# Patient Record
Sex: Female | Born: 2000 | Race: Black or African American | Marital: Single | Smoking: Never smoker
Health system: Southern US, Community
[De-identification: ages and names within clinical notes are randomized; demographics above are authoritative.]

## PROBLEM LIST (undated history)

## (undated) DIAGNOSIS — Z789 Other specified health status: Secondary | ICD-10-CM

## (undated) DIAGNOSIS — O139 Gestational [pregnancy-induced] hypertension without significant proteinuria, unspecified trimester: Secondary | ICD-10-CM

## (undated) HISTORY — PX: LIPOMA EXCISION: SHX5283

## (undated) HISTORY — DX: Other specified health status: Z78.9

## (undated) HISTORY — DX: Gestational (pregnancy-induced) hypertension without significant proteinuria, unspecified trimester: O13.9

---

## 2018-07-26 DIAGNOSIS — R1030 Lower abdominal pain, unspecified: Secondary | ICD-10-CM | POA: Diagnosis not present

## 2018-07-26 DIAGNOSIS — N926 Irregular menstruation, unspecified: Secondary | ICD-10-CM | POA: Diagnosis not present

## 2018-07-26 DIAGNOSIS — N3000 Acute cystitis without hematuria: Secondary | ICD-10-CM | POA: Diagnosis not present

## 2018-07-31 DIAGNOSIS — M542 Cervicalgia: Secondary | ICD-10-CM | POA: Diagnosis not present

## 2018-07-31 DIAGNOSIS — R0600 Dyspnea, unspecified: Secondary | ICD-10-CM | POA: Diagnosis not present

## 2018-08-05 DIAGNOSIS — H1013 Acute atopic conjunctivitis, bilateral: Secondary | ICD-10-CM | POA: Diagnosis not present

## 2018-08-05 DIAGNOSIS — H40033 Anatomical narrow angle, bilateral: Secondary | ICD-10-CM | POA: Diagnosis not present

## 2018-08-14 DIAGNOSIS — D171 Benign lipomatous neoplasm of skin and subcutaneous tissue of trunk: Secondary | ICD-10-CM | POA: Diagnosis not present

## 2018-08-14 DIAGNOSIS — R109 Unspecified abdominal pain: Secondary | ICD-10-CM | POA: Diagnosis not present

## 2018-08-15 DIAGNOSIS — H1013 Acute atopic conjunctivitis, bilateral: Secondary | ICD-10-CM | POA: Diagnosis not present

## 2018-09-21 DIAGNOSIS — H5213 Myopia, bilateral: Secondary | ICD-10-CM | POA: Diagnosis not present

## 2018-12-26 DIAGNOSIS — Z3201 Encounter for pregnancy test, result positive: Secondary | ICD-10-CM | POA: Diagnosis not present

## 2018-12-26 DIAGNOSIS — N912 Amenorrhea, unspecified: Secondary | ICD-10-CM | POA: Diagnosis not present

## 2019-01-06 ENCOUNTER — Other Ambulatory Visit: Payer: Self-pay | Admitting: Obstetrics and Gynecology

## 2019-01-06 DIAGNOSIS — O3680X0 Pregnancy with inconclusive fetal viability, not applicable or unspecified: Secondary | ICD-10-CM

## 2019-01-07 ENCOUNTER — Other Ambulatory Visit: Payer: Self-pay

## 2019-01-07 ENCOUNTER — Ambulatory Visit (INDEPENDENT_AMBULATORY_CARE_PROVIDER_SITE_OTHER): Payer: Medicaid Other

## 2019-01-07 DIAGNOSIS — O3680X0 Pregnancy with inconclusive fetal viability, not applicable or unspecified: Secondary | ICD-10-CM

## 2019-01-07 DIAGNOSIS — Z3A11 11 weeks gestation of pregnancy: Secondary | ICD-10-CM

## 2019-01-07 NOTE — Progress Notes (Signed)
[place] 8+1 wks,single IUP w/ys,positive fht 155 bpm,normal ovaries bilat,crl 17.69 mm

## 2019-01-21 ENCOUNTER — Ambulatory Visit: Payer: Medicaid Other | Admitting: *Deleted

## 2019-01-21 ENCOUNTER — Encounter: Payer: Medicaid Other | Admitting: Women's Health

## 2019-02-11 ENCOUNTER — Other Ambulatory Visit: Payer: Self-pay | Admitting: Obstetrics and Gynecology

## 2019-02-11 ENCOUNTER — Telehealth: Payer: Self-pay | Admitting: Advanced Practice Midwife

## 2019-02-11 DIAGNOSIS — Z3682 Encounter for antenatal screening for nuchal translucency: Secondary | ICD-10-CM

## 2019-02-11 NOTE — Telephone Encounter (Signed)

## 2019-02-12 ENCOUNTER — Ambulatory Visit (INDEPENDENT_AMBULATORY_CARE_PROVIDER_SITE_OTHER): Payer: Medicaid Other | Admitting: Advanced Practice Midwife

## 2019-02-12 ENCOUNTER — Other Ambulatory Visit: Payer: Self-pay

## 2019-02-12 ENCOUNTER — Encounter: Payer: Self-pay | Admitting: Advanced Practice Midwife

## 2019-02-12 ENCOUNTER — Ambulatory Visit (INDEPENDENT_AMBULATORY_CARE_PROVIDER_SITE_OTHER): Payer: Medicaid Other

## 2019-02-12 VITALS — BP 117/68 | HR 93 | Ht 60.0 in | Wt 164.0 lb

## 2019-02-12 DIAGNOSIS — Z363 Encounter for antenatal screening for malformations: Secondary | ICD-10-CM

## 2019-02-12 DIAGNOSIS — Z3401 Encounter for supervision of normal first pregnancy, first trimester: Secondary | ICD-10-CM | POA: Diagnosis not present

## 2019-02-12 DIAGNOSIS — Z3A13 13 weeks gestation of pregnancy: Secondary | ICD-10-CM | POA: Diagnosis not present

## 2019-02-12 DIAGNOSIS — Z3682 Encounter for antenatal screening for nuchal translucency: Secondary | ICD-10-CM

## 2019-02-12 MED ORDER — BLOOD PRESSURE MONITOR AUTOMAT DEVI: 0 refills | Status: DC

## 2019-02-12 NOTE — Addendum Note (Signed)
Addended by: Christiana Pellant A on: 02/12/2019 02:44 PM   Modules accepted: Orders

## 2019-02-12 NOTE — Progress Notes (Signed)
INITIAL OBSTETRICAL VISIT Patient name: Diana Schmitt Pain MRN [id number]  Date of birth: [birth year] Chief Complaint:   Initial Prenatal Visit  History of Present Illness:   Diana Schmitt Sookdeo is a 18 y.o. G1P0  African American female at 5765w2d by 8 week US with an Estimated Date of Delivery: 08/18/19 being seen today for her initial obstetrical visit.   Her obstetrical history is significant for first baby.   Today she reports no complaints.  Patient's last menstrual period was 10/19/2018 (exact date). Last pap n/a Review of Systems:   Pertinent items are noted in HPI Denies cramping/contractions, leakage of fluid, vaginal bleeding, abnormal vaginal discharge w/ itching/odor/irritation, headaches, visual changes, shortness of breath, chest pain, abdominal pain, severe nausea/vomiting, or problems with urination or bowel movements unless otherwise stated above.  Pertinent History Reviewed:  Reviewed past medical,surgical, social, obstetrical and family history.  Reviewed problem list, medications and allergies. OB History  Gravida Para Term Preterm AB Living  1            SAB TAB Ectopic Multiple Live Births               # Outcome Date GA Lbr Len/2nd Weight Sex Delivery Anes PTL Lv  1 Current            Physical Assessment:   Vitals:   02/12/19 0858 02/12/19 0859  BP: 117/68   Pulse: 93   Weight: 164 lb (74.4 kg)   Height:  5' (1.524 m)  Body mass index is 32.03 kg/m.       Physical Examination:  General appearance - well appearing, and in no distress  Mental status - alert, oriented to person, place, and time  Psych:  She has a normal mood and affect  Skin - warm and dry, normal color, no suspicious lesions noted  Chest - effort normal, all lung fields clear to auscultation bilaterally  Heart - normal rate and regular rhythm  Abdomen - soft, nontender  Extremities:  No swelling or varicosities noted  Fetal Heart Rate (bpm): 143 via US  US 13+2 wks,measurements c/w dates,normal ovaries  bilat,crl 66.52 mm,fhr 143 bpm,posterior placenta gr 0,NB present,NT 1.6 mm  No results found for this or any previous visit (from the past 24 hour(s)).  Assessment & Plan:  1) Low-Risk Pregnancy G1P0 at 6665w2d with an Estimated Date of Delivery: 08/18/19   2) Initial OB visit    Meds:  Meds ordered this encounter  Medications  . Blood Pressure Monitoring (BLOOD PRESSURE MONITOR AUTOMAT) DEVI    Sig: Take BP at home daily.  Alert us if >140/90 more than once.    Dispense:  1 Device    Refill:  0    Order Specific Question:   Supervising Provider    Answer:   Lazaro ArmsEURE, LUTHER H [[id number]]    Initial labs obtained Continue prenatal vitamins Reviewed n/v relief measures and warning s/s to report Reviewed recommended weight gain based on pre-gravid BMI Encouraged well-balanced diet Watched video for carrier screening/genetic testing:  Genetic Screening discussed First Screen and Integrated Screen: requested Cystic fibrosis screening requested SMA screening requested Fragile X screening requested Ultrasound discussed; fetal survey: requested CCNC completed  Follow-up: Return in about 5 weeks (around 03/19/2019) for LROB, FA:OZHYQMVS:Anatomy.   Orders Placed This Encounter  Procedures  . Urine Culture  . GC/Chlamydia Probe Amp  . US OB Comp + 14 Wk  . Integrated 1  . Urinalysis, Routine w reflex microscopic  . Obstetric Panel, Including HIV  .  Sickle cell screen  . Cystic Fibrosis Mutation 97  . Inheritest Core(CF97,SMA,FraX)  . MaterniT21 PLUS Core  . POC Urinalysis Dipstick OB    Christin Fudge DNP, CNM 02/12/2019 1:47 PM

## 2019-02-12 NOTE — Patient Instructions (Addendum)
 First Trimester of Pregnancy The first trimester of pregnancy is from week 1 until the end of week 12 (months 1 through 3). A week after a sperm fertilizes an egg, the egg will implant on the wall of the uterus. This embryo will begin to develop into a baby. Genes from you and your partner are forming the baby. The female genes determine whether the baby is a boy or a girl. At 6-8 weeks, the eyes and face are formed, and the heartbeat can be seen on ultrasound. At the end of 12 weeks, all the baby's organs are formed.  Now that you are pregnant, you will want to do everything you can to have a healthy baby. Two of the most important things are to get good prenatal care and to follow your health care provider's instructions. Prenatal care is all the medical care you receive before the baby's birth. This care will help prevent, find, and treat any problems during the pregnancy and childbirth. BODY CHANGES Your body goes through many changes during pregnancy. The changes vary from woman to woman.   You may gain or lose a couple of pounds at first.  You may feel sick to your stomach (nauseous) and throw up (vomit). If the vomiting is uncontrollable, call your health care provider.  You may tire easily.  You may develop headaches that can be relieved by medicines approved by your health care provider.  You may urinate more often. Painful urination may mean you have a bladder infection.  You may develop heartburn as a result of your pregnancy.  You may develop constipation because certain hormones are causing the muscles that push waste through your intestines to slow down.  You may develop hemorrhoids or swollen, bulging veins (varicose veins).  Your breasts may begin to grow larger and become tender. Your nipples may stick out more, and the tissue that surrounds them (areola) may become darker.  Your gums may bleed and may be sensitive to brushing and flossing.  Dark spots or blotches  (chloasma, mask of pregnancy) may develop on your face. This will likely fade after the baby is born.  Your menstrual periods will stop.  You may have a loss of appetite.  You may develop cravings for certain kinds of food.  You may have changes in your emotions from day to day, such as being excited to be pregnant or being concerned that something may go wrong with the pregnancy and baby.  You may have more vivid and strange dreams.  You may have changes in your hair. These can include thickening of your hair, rapid growth, and changes in texture. Some women also have hair loss during or after pregnancy, or hair that feels dry or thin. Your hair will most likely return to normal after your baby is born. WHAT TO EXPECT AT YOUR PRENATAL VISITS During a routine prenatal visit:  You will be weighed to make sure you and the baby are growing normally.  Your blood pressure will be taken.  Your abdomen will be measured to track your baby's growth.  The fetal heartbeat will be listened to starting around week 10 or 12 of your pregnancy.  Test results from any previous visits will be discussed. Your health care provider may ask you:  How you are feeling.  If you are feeling the baby move.  If you have had any abnormal symptoms, such as leaking fluid, bleeding, severe headaches, or abdominal cramping.  If you have any questions. Other   tests that may be performed during your first trimester include:  Blood tests to find your blood type and to check for the presence of any previous infections. They will also be used to check for low iron levels (anemia) and Rh antibodies. Later in the pregnancy, blood tests for diabetes will be done along with other tests if problems develop.  Urine tests to check for infections, diabetes, or protein in the urine.  An ultrasound to confirm the proper growth and development of the baby.  An amniocentesis to check for possible genetic problems.  Fetal  screens for spina bifida and Down syndrome.  You may need other tests to make sure you and the baby are doing well. HOME CARE INSTRUCTIONS  Medicines  Follow your health care provider's instructions regarding medicine use. Specific medicines may be either safe or unsafe to take during pregnancy.  Take your prenatal vitamins as directed.  If you develop constipation, try taking a stool softener if your health care provider approves. Diet  Eat regular, well-balanced meals. Choose a variety of foods, such as meat or vegetable-based protein, fish, milk and low-fat dairy products, vegetables, fruits, and whole grain breads and cereals. Your health care provider will help you determine the amount of weight gain that is right for you.  Avoid raw meat and uncooked cheese. These carry germs that can cause birth defects in the baby.  Eating four or five small meals rather than three large meals a day may help relieve nausea and vomiting. If you start to feel nauseous, eating a few soda crackers can be helpful. Drinking liquids between meals instead of during meals also seems to help nausea and vomiting.  If you develop constipation, eat more high-fiber foods, such as fresh vegetables or fruit and whole grains. Drink enough fluids to keep your urine clear or pale yellow. Activity and Exercise  Exercise only as directed by your health care provider. Exercising will help you:  Control your weight.  Stay in shape.  Be prepared for labor and delivery.  Experiencing pain or cramping in the lower abdomen or low back is a good sign that you should stop exercising. Check with your health care provider before continuing normal exercises.  Try to avoid standing for long periods of time. Move your legs often if you must stand in one place for a long time.  Avoid heavy lifting.  Wear low-heeled shoes, and practice good posture.  You may continue to have sex unless your health care provider directs you  otherwise. Relief of Pain or Discomfort  Wear a good support bra for breast tenderness.   Take warm sitz baths to soothe any pain or discomfort caused by hemorrhoids. Use hemorrhoid cream if your health care provider approves.   Rest with your legs elevated if you have leg cramps or low back pain.  If you develop varicose veins in your legs, wear support hose. Elevate your feet for 15 minutes, 3-4 times a day. Limit salt in your diet. Prenatal Care  Schedule your prenatal visits by the twelfth week of pregnancy. They are usually scheduled monthly at first, then more often in the last 2 months before delivery.  Write down your questions. Take them to your prenatal visits.  Keep all your prenatal visits as directed by your health care provider. Safety  Wear your seat belt at all times when driving.  Make a list of emergency phone numbers, including numbers for family, friends, the hospital, and police and fire departments. General   Tips  Ask your health care provider for a referral to a local prenatal education class. Begin classes no later than at the beginning of month 6 of your pregnancy.  Ask for help if you have counseling or nutritional needs during pregnancy. Your health care provider can offer advice or refer you to specialists for help with various needs.  Do not use hot tubs, steam rooms, or saunas.  Do not douche or use tampons or scented sanitary pads.  Do not cross your legs for long periods of time.  Avoid cat litter boxes and soil used by cats. These carry germs that can cause birth defects in the baby and possibly loss of the fetus by miscarriage or stillbirth.  Avoid all smoking, herbs, alcohol, and medicines not prescribed by your health care provider. Chemicals in these affect the formation and growth of the baby.  Schedule a dentist appointment. At home, brush your teeth with a soft toothbrush and be gentle when you floss. SEEK MEDICAL CARE IF:   You have  dizziness.  You have mild pelvic cramps, pelvic pressure, or nagging pain in the abdominal area.  You have persistent nausea, vomiting, or diarrhea.  You have a bad smelling vaginal discharge.  You have pain with urination.  You notice increased swelling in your face, hands, legs, or ankles. SEEK IMMEDIATE MEDICAL CARE IF:   You have a fever.  You are leaking fluid from your vagina.  You have spotting or bleeding from your vagina.  You have severe abdominal cramping or pain.  You have rapid weight gain or loss.  You vomit blood or material that looks like coffee grounds.  You are exposed to German measles and have never had them.  You are exposed to fifth disease or chickenpox.  You develop a severe headache.  You have shortness of breath.  You have any kind of trauma, such as from a fall or a car accident. Document Released: 06/26/2001 Document Revised: 11/16/2013 Document Reviewed: 05/12/2013 Exit[employer] Patient Information 2015 [employer]. This information is not intended to replace advice given to you by your health care provider. Make sure you discuss any questions you have with your health care provider.   Nausea & Vomiting  Have saltine crackers or pretzels by your bed and eat a few bites before you raise your head out of bed in the morning  Eat small frequent meals throughout the day instead of large meals  Drink plenty of fluids throughout the day to stay hydrated, just don't drink a lot of fluids with your meals.  This can make your stomach fill up faster making you feel sick  Do not brush your teeth right after you eat  Products with real ginger are good for nausea, like ginger ale and ginger hard candy Make sure it says made with real ginger!  Sucking on sour candy like lemon heads is also good for nausea  If your prenatal vitamins make you nauseated, take them at night so you will sleep through the nausea  Sea Bands  If you feel like you need  medicine for the nausea & vomiting please let us know  If you are unable to keep any fluids or food down please let us know   Constipation  Drink plenty of fluid, preferably water, throughout the day  Eat foods high in fiber such as fruits, vegetables, and grains  Exercise, such as walking, is a good way to keep your bowels regular  Drink warm fluids, especially warm   prune juice, or decaf coffee  Eat a 1/2 cup of real oatmeal (not instant), 1/2 cup applesauce, and 1/2-1 cup warm prune juice every day  If needed, you may take Colace (docusate sodium) stool softener once or twice a day to help keep the stool soft. If you are pregnant, wait until you are out of your first trimester (12-14 weeks of pregnancy)  If you still are having problems with constipation, you may take Miralax once daily as needed to help keep your bowels regular.  If you are pregnant, wait until you are out of your first trimester (12-14 weeks of pregnancy)  Safe Medications in Pregnancy   Acne: Benzoyl Peroxide Salicylic Acid  Backache/Headache: Tylenol: 2 regular strength every 4 hours OR              2 Extra strength every 6 hours  Colds/Coughs/Allergies: Benadryl (alcohol free) 25 mg every 6 hours as needed Breath right strips Claritin Cepacol throat lozenges Chloraseptic throat spray Cold-Eeze- up to three times per day Cough drops, alcohol free Flonase (by prescription only) Guaifenesin Mucinex Robitussin DM (plain only, alcohol free) Saline nasal spray/drops Sudafed (pseudoephedrine) & Actifed ** use only after [redacted] weeks gestation and if you do not have high blood pressure Tylenol Vicks Vaporub Zinc lozenges Zyrtec   Constipation: Colace Ducolax suppositories Fleet enema Glycerin suppositories Metamucil Milk of magnesia Miralax Senokot Smooth move tea  Diarrhea: Kaopectate Imodium A-D  *NO pepto Bismol  Hemorrhoids: Anusol Anusol HC Preparation H Tucks  Indigestion:  Tums Maalox Mylanta Zantac  Pepcid  Insomnia: Benadryl (alcohol free) 25mg  every 6 hours as needed Tylenol PM Unisom, no Gelcaps  Leg Cramps: Tums MagGel  Nausea/Vomiting:  Bonine Dramamine Emetrol Ginger extract Sea bands Meclizine  Nausea medication to take during pregnancy:  Unisom (doxylamine succinate 25 mg tablets) Take one tablet daily at bedtime. If symptoms are not adequately controlled, the dose can be increased to a maximum recommended dose of two tablets daily (1/2 tablet in the morning, 1/2 tablet mid-afternoon and one at bedtime). Vitamin B6 100mg  tablets. Take one tablet twice a day (up to 200 mg per day).  Skin Rashes: Aveeno products Benadryl cream or 25mg  every 6 hours as needed Calamine Lotion 1% cortisone cream  Yeast infection: Gyne-lotrimin 7 Monistat 7   **If taking multiple medications, please check labels to avoid duplicating the same active ingredients **take medication as directed on the label ** Do not exceed 4000 mg of tylenol in 24 hours **Do not take medications that contain aspirin or ibuprofen     "MATERNITY21" TEST WILL TELL THE SEX OF THE BABY!!!!!

## 2019-02-12 NOTE — Progress Notes (Signed)
[place] 13+2 wks,measurements c/w dates,normal ovaries bilat,crl 66.52 mm,fhr 143 bpm,posterior placenta gr 0,NB present,NT 1.6 mm

## 2019-02-13 LAB — PMP SCREEN PROFILE (10S), URINE
Amphetamine Scrn, Ur: NEGATIVE ng/mL
BARBITURATE SCREEN URINE: NEGATIVE ng/mL
BENZODIAZEPINE SCREEN, URINE: NEGATIVE ng/mL
CANNABINOIDS UR QL SCN: NEGATIVE ng/mL
Cocaine (Metab) Scrn, Ur: NEGATIVE ng/mL
Creatinine(Crt), U: 388.4 mg/dL — ABNORMAL HIGH (ref 20.0–300.0)
Methadone Screen, Urine: NEGATIVE ng/mL
OXYCODONE+OXYMORPHONE UR QL SCN: NEGATIVE ng/mL
Opiate Scrn, Ur: NEGATIVE ng/mL
Ph of Urine: 6.6 (ref 4.5–8.9)
Phencyclidine Qn, Ur: NEGATIVE ng/mL
Propoxyphene Scrn, Ur: NEGATIVE ng/mL

## 2019-02-13 LAB — SPECIMEN STATUS REPORT

## 2019-02-14 LAB — SPECIMEN STATUS REPORT

## 2019-02-14 LAB — URINE CULTURE

## 2019-02-20 LAB — CYSTIC FIBROSIS MUTATION 97: Interpretation: NOT DETECTED

## 2019-02-24 ENCOUNTER — Encounter: Payer: Self-pay | Admitting: Advanced Practice Midwife

## 2019-02-24 DIAGNOSIS — O09619 Supervision of young primigravida, unspecified trimester: Secondary | ICD-10-CM | POA: Insufficient documentation

## 2019-03-04 LAB — URINALYSIS, ROUTINE W REFLEX MICROSCOPIC
Bilirubin, UA: NEGATIVE
Glucose, UA: NEGATIVE
Leukocytes,UA: NEGATIVE
Nitrite, UA: NEGATIVE
RBC, UA: NEGATIVE
Specific Gravity, UA: >=1.03 — AB (ref 1.005–1.030)
Urobilinogen, Ur: 0.2 mg/dL (ref 0.2–1.0)
pH, UA: 6.5 (ref 5.0–7.5)

## 2019-03-04 LAB — OBSTETRIC PANEL, INCLUDING HIV
Antibody Screen: NEGATIVE
Basophils Absolute: 0 10*3/uL (ref 0.0–0.2)
Basos: 0 %
EOS (ABSOLUTE): 0 10*3/uL (ref 0.0–0.4)
Eos: 0 %
HIV Screen 4th Generation wRfx: NONREACTIVE
Hematocrit: 35.6 % (ref 34.0–46.6)
Hemoglobin: 11.8 g/dL (ref 11.1–15.9)
Hepatitis B Surface Ag: NEGATIVE
Immature Grans (Abs): 0 10*3/uL (ref 0.0–0.1)
Immature Granulocytes: 0 %
Lymphocytes Absolute: 1.9 10*3/uL (ref 0.7–3.1)
Lymphs: 33 %
MCH: 26.2 pg — ABNORMAL LOW (ref 26.6–33.0)
MCHC: 33.1 g/dL (ref 31.5–35.7)
MCV: 79 fL (ref 79–97)
Monocytes Absolute: 0.4 10*3/uL (ref 0.1–0.9)
Monocytes: 6 %
Neutrophils Absolute: 3.5 10*3/uL (ref 1.4–7.0)
Neutrophils: 61 %
Platelets: 213 10*3/uL (ref 150–450)
RBC: 4.51 x10E6/uL (ref 3.77–5.28)
RDW: 13.9 % (ref 11.7–15.4)
RPR Ser Ql: NONREACTIVE
Rh Factor: POSITIVE
Rubella Antibodies, IGG: 2.23 index (ref >0.99)
WBC: 5.8 10*3/uL (ref 3.4–10.8)

## 2019-03-04 LAB — INTEGRATED 1
Crown Rump Length: 66.5 mm
Gest. Age on Collection Date: 12.7 weeks
Maternal Age at EDD: 20.1 yr
Nuchal Translucency (NT): 1.6 mm
Number of Fetuses: 1
PAPP-A Value: 1399 ng/mL
Weight: 164 [lb_av]

## 2019-03-04 LAB — INHERITEST CORE(CF97,SMA,FRAX)

## 2019-03-04 LAB — MICROSCOPIC EXAMINATION
Bacteria, UA: NONE SEEN
Casts: NONE SEEN /lpf
Epithelial Cells (non renal): >10 /hpf — AB (ref 0–10)

## 2019-03-04 LAB — MATERNIT 21 PLUS CORE, BLOOD
Fetal Fraction: 4
Result (T21): NEGATIVE
Trisomy 13 (Patau syndrome): NEGATIVE
Trisomy 18 (Edwards syndrome): NEGATIVE
Trisomy 21 (Down syndrome): NEGATIVE

## 2019-03-04 LAB — SICKLE CELL SCREEN: Sickle Cell Screen: NEGATIVE

## 2019-03-19 ENCOUNTER — Other Ambulatory Visit: Payer: Self-pay

## 2019-03-19 ENCOUNTER — Ambulatory Visit (INDEPENDENT_AMBULATORY_CARE_PROVIDER_SITE_OTHER): Payer: Medicaid Other

## 2019-03-19 ENCOUNTER — Encounter: Payer: Self-pay | Admitting: Women's Health

## 2019-03-19 ENCOUNTER — Ambulatory Visit (INDEPENDENT_AMBULATORY_CARE_PROVIDER_SITE_OTHER): Payer: Medicaid Other | Admitting: Women's Health

## 2019-03-19 VITALS — BP 134/79 | HR 89 | Wt 161.5 lb

## 2019-03-19 DIAGNOSIS — Z3402 Encounter for supervision of normal first pregnancy, second trimester: Secondary | ICD-10-CM | POA: Diagnosis not present

## 2019-03-19 DIAGNOSIS — Z3A18 18 weeks gestation of pregnancy: Secondary | ICD-10-CM

## 2019-03-19 DIAGNOSIS — Z3401 Encounter for supervision of normal first pregnancy, first trimester: Secondary | ICD-10-CM | POA: Diagnosis not present

## 2019-03-19 DIAGNOSIS — Z363 Encounter for antenatal screening for malformations: Secondary | ICD-10-CM | POA: Diagnosis not present

## 2019-03-19 DIAGNOSIS — Z3A13 13 weeks gestation of pregnancy: Secondary | ICD-10-CM | POA: Diagnosis not present

## 2019-03-19 DIAGNOSIS — Z1379 Encounter for other screening for genetic and chromosomal anomalies: Secondary | ICD-10-CM | POA: Diagnosis not present

## 2019-03-19 DIAGNOSIS — O26842 Uterine size-date discrepancy, second trimester: Secondary | ICD-10-CM

## 2019-03-19 LAB — POCT URINALYSIS DIPSTICK OB
Blood, UA: NEGATIVE
Glucose, UA: NEGATIVE
Ketones, UA: NEGATIVE
Leukocytes, UA: NEGATIVE
Nitrite, UA: NEGATIVE
POC,PROTEIN,UA: NEGATIVE

## 2019-03-19 MED ORDER — BLOOD PRESSURE MONITOR MISC: 0 refills | Status: DC

## 2019-03-19 NOTE — Progress Notes (Signed)
   LOW-RISK PREGNANCY VISIT Patient name: Diana Schmitt MRN [id number]  Date of birth: 2000/08/24 Chief Complaint:   Routine Prenatal Visit ([place] today)  History of Present Illness:   Diana Schmitt is a 18 y.o. G1P0 female at [redacted]w[redacted]d with an Estimated Date of Delivery: 08/18/19 being seen today for ongoing management of a low-risk pregnancy.  Today she reports no complaints. Contractions: Not present. Vag. Bleeding: None.  Movement: Present. denies leaking of fluid. Review of Systems:   Pertinent items are noted in HPI Denies abnormal vaginal discharge w/ itching/odor/irritation, headaches, visual changes, shortness of breath, chest pain, abdominal pain, severe nausea/vomiting, or problems with urination or bowel movements unless otherwise stated above. Pertinent History Reviewed:  Reviewed past medical,surgical, social, obstetrical and family history.  Reviewed problem list, medications and allergies. Physical Assessment:   Vitals:   03/19/19 1009  BP: 134/79  Pulse: 89  Weight: 161 lb 8 oz (73.3 kg)  Body mass index is 31.54 kg/m.        Physical Examination:   General appearance: Well appearing, and in no distress  Mental status: Alert, oriented to person, place, and time  Skin: Warm & dry  Cardiovascular: Normal heart rate noted  Respiratory: Normal respiratory effort, no distress  Abdomen: Soft, gravid, nontender  Pelvic: Cervical exam deferred         Extremities: Edema: Trace  Fetal Status:     Movement: Present    [place] 18+2 wks,breech,posterior placenta gr 0,normal ovaries bilat,cx 2.7 cm,fhr 132 bpm,svp of fluid 3.6 cm,EFW 203 g 14%,anatomy complete  Results for orders placed or performed in visit on 02/12/19 (from the past 24 hour(s))  POC Urinalysis Dipstick OB   Collection Time: 03/19/19 10:10 AM  Result Value Ref Range   Color, UA     Clarity, UA     Glucose, UA Negative Negative   Bilirubin, UA     Ketones, UA neg    Spec Grav, UA     Blood, UA neg    pH, UA     POC,PROTEIN,UA Negative Negative, Trace, Small (1+), Moderate (2+), Large (3+), 4+   Urobilinogen, UA     Nitrite, UA neg    Leukocytes, UA Negative Negative   Appearance     Odor      Assessment & Plan:  1) Low-risk pregnancy G1P0 at [redacted]w[redacted]d with an Estimated Date of Delivery: 08/18/19   2) EFW 14%, low normal, well f/u in 4wks to monitor   Meds:  Meds ordered this encounter  Medications  . Blood Pressure Monitor MISC    Sig: For regular home bp monitoring during pregnancy    Dispense:  1 each    Refill:  0    Dx: z34.90    Order Specific Question:   Supervising Provider    Answer:   Florian Buff [[id number]]   Labs/procedures today: anatomy u/s, 2nd IT  Plan:  Continue routine obstetrical care   Reviewed: Preterm labor symptoms and general obstetric precautions including but not limited to vaginal bleeding, contractions, leaking of fluid and fetal movement were reviewed in detail with the patient.  All questions were answered. Does not havehome bp cuff. Rx faxed to [facility]. Check bp weekly, let us know if >140/90.   Follow-up: Return in about 4 weeks (around 04/16/2019) for [place], US:EFW, in person.  Orders Placed This Encounter  Procedures  . US OB Follow Up  . INTEGRATED 2   [place], [facility] 03/19/2019 10:48 AM

## 2019-03-19 NOTE — Patient Instructions (Signed)
Diana Schmitt, I greatly value your feedback.  If you receive a survey following your visit with [place] today, we appreciate you taking the time to fill it out.  Thanks, Diana Schmitt, CNM, [facility]  [place]!!! It is now Diana Schmitt at [facility] ([place], [place] [address]) Entrance located off of [address] parking   Go to [email or web address] to register for FREE online childbirth classes  [place] Pediatricians/Family Doctors:  [facility] [place] [phone]                 [place] [phone] (usually not accepting new patients unless you have family there already, you are always welcome to call and ask)       [facility] Department [phone]       [facility] Pediatricians/Family Doctors:   [facility]: [phone]  [facility]: [phone]  [facility]: [place] Doctors:   [facility]: [facility]: [place]:  [place]: [phone]    Home Blood Pressure Monitoring for Patients   Your provider has recommended that you check your blood pressure (BP) at least once a week at home. If you do not have a blood pressure cuff at home, one will be provided for you. Contact your provider if you have not received your monitor within 1 week.   Helpful Tips for Accurate Home Blood Pressure Checks  . Don't smoke, exercise, or drink caffeine 30 minutes before checking your BP . Use the restroom before checking your BP (a full bladder can raise your pressure) . Relax in a comfortable upright chair . Feet on the ground . Left arm resting comfortably on a flat surface at the level of your heart . Legs uncrossed . Back supported . Sit quietly and don't talk . Place the cuff on  your bare arm . Adjust snuggly, so that only two fingertips can fit between your skin and the top of the cuff . Check 2 readings separated by at least one minute . Keep a log of your BP readings . For a visual, please reference this diagram: http://ccnc.care/bpdiagram  Provider Name: [facility]     Phone: [phone]  Zone 1: ALL CLEAR  Continue to monitor your symptoms:  . BP reading is less than 140 (top number) or less than 90 (bottom number)  . No right upper stomach pain . No headaches or seeing spots . No feeling nauseated or throwing up . No swelling in face and hands  Zone 2: CAUTION Call your doctor's office for any of the following:  . BP reading is greater than 140 (top number) or greater than 90 (bottom number)  . Stomach pain under your ribs in the middle or right side . Headaches or seeing spots . Feeling nauseated or throwing up . Swelling in face and hands  Zone 3: EMERGENCY  Seek immediate medical care if you have any of the following:  . BP reading is greater than160 (top number) or greater than 110 (bottom number) . Severe headaches not improving with Tylenol . Serious difficulty catching your breath . Any worsening symptoms from Zone 2     Second Trimester of Pregnancy The second trimester is from week 14 through week 27 (months 4 through 6). The second trimester is often  a time when you feel your best. Your body has adjusted to being pregnant, and you begin to feel better physically. Usually, morning sickness has lessened or quit completely, you may have more energy, and you may have an increase in appetite. The second trimester is also a time when the fetus is growing rapidly. At the end of the sixth month, the fetus is about 9 inches long and weighs about 1 pounds. You will likely begin to feel the baby move (quickening) between 16 and 20 weeks of pregnancy. Body changes during your second trimester Your body continues to go through many changes  during your second trimester. The changes vary from woman to woman.  Your weight will continue to increase. You will notice your lower abdomen bulging out.  You may begin to get stretch marks on your hips, abdomen, and breasts.  You may develop headaches that can be relieved by medicines. The medicines should be approved by your health care provider.  You may urinate more often because the fetus is pressing on your bladder.  You may develop or continue to have heartburn as a result of your pregnancy.  You may develop constipation because certain hormones are causing the muscles that push waste through your intestines to slow down.  You may develop hemorrhoids or swollen, bulging veins (varicose veins).  You may have back pain. This is caused by: ? Weight gain. ? Pregnancy hormones that are relaxing the joints in your pelvis. ? A shift in weight and the muscles that support your balance.  Your breasts will continue to grow and they will continue to become tender.  Your gums may bleed and may be sensitive to brushing and flossing.  Dark spots or blotches (chloasma, mask of pregnancy) may develop on your face. This will likely fade after the baby is born.  A dark line from your belly button to the pubic area (linea nigra) may appear. This will likely fade after the baby is born.  You may have changes in your hair. These can include thickening of your hair, rapid growth, and changes in texture. Some women also have hair loss during or after pregnancy, or hair that feels dry or thin. Your hair will most likely return to normal after your baby is born.  What to expect at prenatal visits During a routine prenatal visit:  You will be weighed to make sure you and the fetus are growing normally.  Your blood pressure will be taken.  Your abdomen will be measured to track your baby's growth.  The fetal heartbeat will be listened to.  Any test results from the previous visit will be  discussed.  Your health care provider may ask you:  How you are feeling.  If you are feeling the baby move.  If you have had any abnormal symptoms, such as leaking fluid, bleeding, severe headaches, or abdominal cramping.  If you are using any tobacco products, including cigarettes, chewing tobacco, and electronic cigarettes.  If you have any questions.  Other tests that may be performed during your second trimester include:  Blood tests that check for: ? Low iron levels (anemia). ? High blood sugar that affects pregnant women (gestational diabetes) between 16 and 28 weeks. ? Rh antibodies. This is to check for a protein on red blood cells (Rh factor).  Urine tests to check for infections, diabetes, or protein in the urine.  An ultrasound to confirm the proper growth and development of the baby.  An amniocentesis to check  for possible genetic problems.  Fetal screens for spina bifida and Down syndrome.  HIV (human immunodeficiency virus) testing. Routine prenatal testing includes screening for HIV, unless you choose not to have this test.  Follow these instructions at home: Medicines  Follow your health care provider's instructions regarding medicine use. Specific medicines may be either safe or unsafe to take during pregnancy.  Take a prenatal vitamin that contains at least 600 micrograms (mcg) of folic acid.  If you develop constipation, try taking a stool softener if your health care provider approves. Eating and drinking  Eat a balanced diet that includes fresh fruits and vegetables, whole grains, good sources of protein such as meat, eggs, or tofu, and low-fat dairy. Your health care provider will help you determine the amount of weight gain that is right for you.  Avoid raw meat and uncooked cheese. These carry germs that can cause birth defects in the baby.  If you have low calcium intake from food, talk to your health care provider about whether you should take a  daily calcium supplement.  Limit foods that are high in fat and processed sugars, such as fried and sweet foods.  To prevent constipation: ? Drink enough fluid to keep your urine clear or pale yellow. ? Eat foods that are high in fiber, such as fresh fruits and vegetables, whole grains, and beans. Activity  Exercise only as directed by your health care provider. Most women can continue their usual exercise routine during pregnancy. Try to exercise for 30 minutes at least 5 days a week. Stop exercising if you experience uterine contractions.  Avoid heavy lifting, wear low heel shoes, and practice good posture.  A sexual relationship may be continued unless your health care provider directs you otherwise. Relieving pain and discomfort  Wear a good support bra to prevent discomfort from breast tenderness.  Take warm sitz baths to soothe any pain or discomfort caused by hemorrhoids. Use hemorrhoid cream if your health care provider approves.  Rest with your legs elevated if you have leg cramps or low back pain.  If you develop varicose veins, wear support hose. Elevate your feet for 15 minutes, 3-4 times a day. Limit salt in your diet. Prenatal Care  Write down your questions. Take them to your prenatal visits.  Keep all your prenatal visits as told by your health care provider. This is important. Safety  Wear your seat belt at all times when driving.  Make a list of emergency phone numbers, including numbers for family, friends, the hospital, and police and fire departments. General instructions  Ask your health care provider for a referral to a local prenatal education class. Begin classes no later than the beginning of month 6 of your pregnancy.  Ask for help if you have counseling or nutritional needs during pregnancy. Your health care provider can offer advice or refer you to specialists for help with various needs.  Do not use hot tubs, steam rooms, or saunas.  Do not  douche or use tampons or scented sanitary pads.  Do not cross your legs for long periods of time.  Avoid cat litter boxes and soil used by cats. These carry germs that can cause birth defects in the baby and possibly loss of the fetus by miscarriage or stillbirth.  Avoid all smoking, herbs, alcohol, and unprescribed drugs. Chemicals in these products can affect the formation and growth of the baby.  Do not use any products that contain nicotine or tobacco, such as cigarettes  and e-cigarettes. If you need help quitting, ask your health care provider.  Visit your dentist if you have not gone yet during your pregnancy. Use a soft toothbrush to brush your teeth and be gentle when you floss. Contact a health care provider if:  You have dizziness.  You have mild pelvic cramps, pelvic pressure, or nagging pain in the abdominal area.  You have persistent nausea, vomiting, or diarrhea.  You have a bad smelling vaginal discharge.  You have pain when you urinate. Get help right away if:  You have a fever.  You are leaking fluid from your vagina.  You have spotting or bleeding from your vagina.  You have severe abdominal cramping or pain.  You have rapid weight gain or weight loss.  You have shortness of breath with chest pain.  You notice sudden or extreme swelling of your face, hands, ankles, feet, or legs.  You have not felt your baby move in over an hour.  You have severe headaches that do not go away when you take medicine.  You have vision changes. Summary  The second trimester is from week 14 through week 27 (months 4 through 6). It is also a time when the fetus is growing rapidly.  Your body goes through many changes during pregnancy. The changes vary from woman to woman.  Avoid all smoking, herbs, alcohol, and unprescribed drugs. These chemicals affect the formation and growth your baby.  Do not use any tobacco products, such as cigarettes, chewing tobacco, and  e-cigarettes. If you need help quitting, ask your health care provider.  Contact your health care provider if you have any questions. Keep all prenatal visits as told by your health care provider. This is important. This information is not intended to replace advice given to you by your health care provider. Make sure you discuss any questions you have with your health care provider. Document Released: 06/26/2001 Document Revised: 12/08/2015 Document Reviewed: 09/02/2012 Elsevier Interactive Patient Education  2017 [employer].

## 2019-03-19 NOTE — Progress Notes (Signed)
[place] 18+2 wks,breech,posterior placenta gr 0,normal ovaries bilat,cx 2.7 cm,fhr 132 bpm,svp of fluid 3.6 cm,EFW 203 g 14%,anatomy complete

## 2019-03-24 LAB — INTEGRATED 2
AFP MoM: 1.7
Alpha-Fetoprotein: 68.9 ng/mL
Crown Rump Length: 66.5 mm
DIA MoM: 0.85
DIA Value: 134.9 pg/mL
Estriol, Unconjugated: 2.22 ng/mL
Gest. Age on Collection Date: 12.7 weeks
Gestational Age: 17.7 weeks
Maternal Age at EDD: 20.1 yr
Nuchal Translucency (NT): 1.6 mm
Nuchal Translucency MoM: 1.07
Number of Fetuses: 1
PAPP-A MoM: 1.36
PAPP-A Value: 1399 ng/mL
Test Results:: NEGATIVE
Weight: 161 [lb_av]
Weight: 164 [lb_av]
hCG MoM: 0.92
hCG Value: 23.7 IU/mL
uE3 MoM: 1.77

## 2019-03-24 LAB — GC/CHLAMYDIA PROBE AMP
Chlamydia trachomatis, NAA: NEGATIVE
Neisseria Gonorrhoeae by PCR: NEGATIVE

## 2019-04-16 ENCOUNTER — Other Ambulatory Visit: Payer: Self-pay

## 2019-04-16 ENCOUNTER — Ambulatory Visit (INDEPENDENT_AMBULATORY_CARE_PROVIDER_SITE_OTHER): Payer: Medicaid Other | Admitting: Advanced Practice Midwife

## 2019-04-16 ENCOUNTER — Ambulatory Visit (INDEPENDENT_AMBULATORY_CARE_PROVIDER_SITE_OTHER): Payer: Medicaid Other

## 2019-04-16 ENCOUNTER — Encounter: Payer: Self-pay | Admitting: Advanced Practice Midwife

## 2019-04-16 VITALS — BP 126/76 | HR 101 | Wt 163.0 lb

## 2019-04-16 DIAGNOSIS — Z3A22 22 weeks gestation of pregnancy: Secondary | ICD-10-CM

## 2019-04-16 DIAGNOSIS — O26843 Uterine size-date discrepancy, third trimester: Secondary | ICD-10-CM

## 2019-04-16 DIAGNOSIS — O26842 Uterine size-date discrepancy, second trimester: Secondary | ICD-10-CM | POA: Diagnosis not present

## 2019-04-16 DIAGNOSIS — Z331 Pregnant state, incidental: Secondary | ICD-10-CM

## 2019-04-16 DIAGNOSIS — Z3402 Encounter for supervision of normal first pregnancy, second trimester: Secondary | ICD-10-CM

## 2019-04-16 DIAGNOSIS — Z1389 Encounter for screening for other disorder: Secondary | ICD-10-CM

## 2019-04-16 MED ORDER — OMEPRAZOLE 20 MG PO CPDR: 20.0000 mg | DELAYED_RELEASE_CAPSULE | Freq: Every day | ORAL | 6 refills | Status: DC

## 2019-04-16 NOTE — Patient Instructions (Signed)

## 2019-04-16 NOTE — Progress Notes (Signed)
[place] 84+1 wks,cephalic,CX 4 cm,svp of fluid 4.4 cm,normal ovaries bilat,fhr 142 bpm,EFW 435 g 15%, AC 21%,BPD 2.6%,HC 2.9 %,FL 26%

## 2019-04-16 NOTE — Progress Notes (Signed)
   LOW-RISK PREGNANCY VISIT Patient name: Diana Schmitt MRN [id number]  Date of birth: [birth year] Chief Complaint:   Routine Prenatal Visit (u/s f/u)  History of Present Illness:   Diana Schmitt is a 18 y.o. G1P0 female at [redacted]w[redacted]d with an Estimated Date of Delivery: 08/18/19 being seen today for ongoing management of a low-risk pregnancy.  Today she reports heartburn  Taking pepcid but doesn't like the taste. . Contractions: Not present. Vag. Bleeding: None.  Movement: Present. denies leaking of fluid. Review of Systems:   Pertinent items are noted in HPI Denies abnormal vaginal discharge w/ itching/odor/irritation, headaches, visual changes, shortness of breath, chest pain, abdominal pain, severe nausea/vomiting, or problems with urination or bowel movements unless otherwise stated above.  Pertinent History Reviewed:  Medical & Surgical Hx:   Past Medical History:  Diagnosis Date  . Medical history non-contributory    Past Surgical History:  Procedure Laterality Date  . LIPOMA EXCISION N/A    History reviewed. No pertinent family history.  Current Outpatient Medications:  .  Famotidine (PEPCID PO), Take by mouth daily., Disp: , Rfl:  .  Prenatal Vit-Fe Fumarate-FA (PRENATAL VITAMIN PO), Take by mouth daily., Disp: , Rfl:  .  Blood Pressure Monitor MISC, For regular home bp monitoring during pregnancy (Patient not taking: Reported on 04/16/2019), Disp: 1 each, Rfl: 0 .  Blood Pressure Monitoring (BLOOD PRESSURE MONITOR AUTOMAT) DEVI, Take BP at home daily.  Alert [place] if >140/90 more than once. (Patient not taking: Reported on 04/16/2019), Disp: 1 Device, Rfl: 0 Social History: Reviewed -  reports that she has never smoked. She has never used smokeless tobacco.  Physical Assessment:   Vitals:   04/16/19 1003  BP: 126/76  Pulse: (!) 101  Weight: 163 lb (73.9 kg)  Body mass index is 31.83 kg/m.        Physical Examination:   General appearance: Well appearing, and in no distress  Mental  status: Alert, oriented to person, place, and time  Skin: Warm & dry  Cardiovascular: Normal heart rate noted  Respiratory: Normal respiratory effort, no distress  Abdomen: Soft, gravid, nontender  Pelvic: Cervical exam deferred         Extremities: Edema: None  Fetal Status:     Movement: Present   [place] 35+0 wks,cephalic,CX 4 cm,svp of fluid 4.4 cm,normal ovaries bilat,fhr 142 bpm,EFW 435 g 15%, AC 21%,BPD 2.6%,HC 2.9 %,FL 26% No results found for this or any previous visit (from the past 24 hour(s)).  Assessment & Plan:  1) Low-risk pregnancy G1P0 at [redacted]w[redacted]d with an Estimated Date of Delivery: 08/18/19   2) EFW 15% w/nl AC and HC 2.9%.  Discussed w/LHE, repeat in 5 weeks w/PN2,    Labs/procedures/US today: EFW  Plan:  Continue routine obstetrical care    Follow-up: Return in about 5 weeks (around 05/21/2019) for US:EFW, PN2/LROB.  No orders of the defined types were placed in this encounter.  Joaquim Lai Cresenzo-Dishmon CNM 04/16/2019 10:25 AM

## 2019-05-21 ENCOUNTER — Ambulatory Visit (INDEPENDENT_AMBULATORY_CARE_PROVIDER_SITE_OTHER): Payer: Medicaid Other

## 2019-05-21 ENCOUNTER — Other Ambulatory Visit: Payer: Self-pay | Admitting: Advanced Practice Midwife

## 2019-05-21 ENCOUNTER — Ambulatory Visit (INDEPENDENT_AMBULATORY_CARE_PROVIDER_SITE_OTHER): Payer: Medicaid Other | Admitting: Family Medicine

## 2019-05-21 ENCOUNTER — Other Ambulatory Visit: Payer: Medicaid Other

## 2019-05-21 ENCOUNTER — Other Ambulatory Visit: Payer: Self-pay

## 2019-05-21 VITALS — BP 143/84 | HR 107 | Wt 168.0 lb

## 2019-05-21 DIAGNOSIS — O99212 Obesity complicating pregnancy, second trimester: Secondary | ICD-10-CM

## 2019-05-21 DIAGNOSIS — Z3402 Encounter for supervision of normal first pregnancy, second trimester: Secondary | ICD-10-CM

## 2019-05-21 DIAGNOSIS — O365990 Maternal care for other known or suspected poor fetal growth, unspecified trimester, not applicable or unspecified: Secondary | ICD-10-CM

## 2019-05-21 DIAGNOSIS — O26892 Other specified pregnancy related conditions, second trimester: Secondary | ICD-10-CM

## 2019-05-21 DIAGNOSIS — O365930 Maternal care for other known or suspected poor fetal growth, third trimester, not applicable or unspecified: Secondary | ICD-10-CM | POA: Diagnosis not present

## 2019-05-21 DIAGNOSIS — O26843 Uterine size-date discrepancy, third trimester: Secondary | ICD-10-CM

## 2019-05-21 DIAGNOSIS — Z3A27 27 weeks gestation of pregnancy: Secondary | ICD-10-CM

## 2019-05-21 DIAGNOSIS — Z331 Pregnant state, incidental: Secondary | ICD-10-CM

## 2019-05-21 DIAGNOSIS — O99210 Obesity complicating pregnancy, unspecified trimester: Secondary | ICD-10-CM | POA: Insufficient documentation

## 2019-05-21 DIAGNOSIS — O09619 Supervision of young primigravida, unspecified trimester: Secondary | ICD-10-CM

## 2019-05-21 DIAGNOSIS — Z1389 Encounter for screening for other disorder: Secondary | ICD-10-CM

## 2019-05-21 DIAGNOSIS — O365120 Maternal care for known or suspected placental insufficiency, second trimester, not applicable or unspecified: Secondary | ICD-10-CM

## 2019-05-21 DIAGNOSIS — R030 Elevated blood-pressure reading, without diagnosis of hypertension: Secondary | ICD-10-CM | POA: Insufficient documentation

## 2019-05-21 DIAGNOSIS — O09612 Supervision of young primigravida, second trimester: Secondary | ICD-10-CM

## 2019-05-21 DIAGNOSIS — O99213 Obesity complicating pregnancy, third trimester: Secondary | ICD-10-CM

## 2019-05-21 LAB — POCT URINALYSIS DIPSTICK OB
Blood, UA: NEGATIVE
Glucose, UA: NEGATIVE
Ketones, UA: NEGATIVE
Leukocytes, UA: NEGATIVE
Nitrite, UA: NEGATIVE
POC,PROTEIN,UA: NEGATIVE

## 2019-05-21 NOTE — Progress Notes (Signed)
[place] 27+2 wks,breech,posterior placenta gr 0,normal ovaries bilat,cx 3.2 cm,afi 10.9 cm,efw 829 g 3.1%,RI .7,.73,.74=77%

## 2019-05-21 NOTE — Progress Notes (Signed)
    PRENATAL VISIT NOTE  Subjective:  Diana Schmitt is a 18 y.o. G1P0 at [redacted]w[redacted]d being seen today for ongoing prenatal care.  She is currently monitored for the following issues for this low-risk pregnancy and has Supervision of high-risk pregnancy of young primigravida; Poor fetal growth affecting pregnancy, antepartum; Obesity affecting pregnancy; and Elevated BP without diagnosis of hypertension on their problem list.  Patient reports no complaints.  Contractions: Not present. Vag. Bleeding: None.  Movement: Present. Denies leaking of fluid.   The following portions of the patient's history were reviewed and updated as appropriate: allergies, current medications, past family history, past medical history, past social history, past surgical history and problem list. Problem list updated.  Objective:   Vitals:   05/21/19 1011  BP: (!) 143/84  Pulse: (!) 107  Weight: 168 lb (76.2 kg)    Fetal Status: Fetal Heart Rate (bpm): 145 Fundal Height: 28 cm Movement: Present     General:  Alert, oriented and cooperative. Patient is in no acute distress.  Skin: Skin is warm and dry. No rash noted.   Cardiovascular: Normal heart rate noted  Respiratory: Normal respiratory effort, no problems with respiration noted  Abdomen: Soft, gravid, appropriate for gestational age.  Pain/Pressure: Absent     Pelvic: Cervical exam deferred        Extremities: Normal range of motion.  Edema: None  Mental Status: Normal mood and affect. Normal behavior. Normal judgment and thought content.   Assessment and Plan:  Pregnancy: G1P0 at [redacted]w[redacted]d  1. Encounter for supervision of normal first pregnancy in third trimester Up to date 3rd trimester labs today Declined flu shot -- worried it will make her sick. Counseled on risk of flu severity in pregnancy.   2. Poor fetal growth affecting pregnancy, antepartum, single or unspecified fetus [place] today showed- [place] 27+2 wks,breech,posterior placenta gr 0,normal ovaries  bilat,cx 3.2 cm,afi 10.9 cm,efw 829 g 3.1%,RI .7,.73,.74=77% Concern for IUGR, initiate q 3-4 week growth [place] Start weekly BPP IOL at 38-39 wks   3. Elevated BP without diagnosis of hypertension Denies HA, changes in vision, RUQ pain, swelling. BP was 143/94 and similar to recheck.  - Return in 1 day for BP check, concern for gHTN. Will get labs next visit - CMP; Future - Protein / creatinine ratio, urine; Future - CBC; Future   Preterm labor symptoms and general obstetric precautions including but not limited to vaginal bleeding, contractions, leaking of fluid and fetal movement were reviewed in detail with the patient. Please refer to After Visit Summary for other counseling recommendations.   Return in about 1 day (around 05/22/2019) for BP Check.  Future Appointments  Date Time Provider [place]  05/22/2019  8:30 AM [facility] NURSE [facility]  05/29/2019  9:10 AM [facility] NURSE [facility]  06/04/2019  9:10 AM Florian Buff, MD [facility]  06/09/2019  8:30 AM [facility] NURSE [facility]  06/15/2019  1:30 PM [place] [facility] [place] [facility] None  06/15/2019  2:10 PM [place], Mertie Clause, MD [facility]    Caren Macadam, MD

## 2019-05-22 LAB — ANTIBODY SCREEN: Antibody Screen: NEGATIVE

## 2019-05-22 LAB — CBC
Hematocrit: 32.8 % — ABNORMAL LOW (ref 34.0–46.6)
Hemoglobin: 10.8 g/dL — ABNORMAL LOW (ref 11.1–15.9)
MCH: 27.3 pg (ref 26.6–33.0)
MCHC: 32.9 g/dL (ref 31.5–35.7)
MCV: 83 fL (ref 79–97)
Platelets: 196 10*3/uL (ref 150–450)
RBC: 3.95 x10E6/uL (ref 3.77–5.28)
RDW: 13.1 % (ref 11.7–15.4)
WBC: 6.7 10*3/uL (ref 3.4–10.8)

## 2019-05-22 LAB — RPR: RPR Ser Ql: NONREACTIVE

## 2019-05-22 LAB — GLUCOSE TOLERANCE, 2 HOURS W/ 1HR
Glucose, 1 hour: 112 mg/dL (ref 65–179)
Glucose, 2 hour: 103 mg/dL (ref 65–152)
Glucose, Fasting: 70 mg/dL (ref 65–91)

## 2019-05-22 LAB — HIV ANTIBODY (ROUTINE TESTING W REFLEX): HIV Screen 4th Generation wRfx: NONREACTIVE

## 2019-05-25 ENCOUNTER — Ambulatory Visit (INDEPENDENT_AMBULATORY_CARE_PROVIDER_SITE_OTHER): Payer: Medicaid Other | Admitting: *Deleted

## 2019-05-25 ENCOUNTER — Encounter: Payer: Self-pay | Admitting: *Deleted

## 2019-05-25 ENCOUNTER — Other Ambulatory Visit: Payer: Self-pay

## 2019-05-25 VITALS — BP 143/83 | HR 127

## 2019-05-25 DIAGNOSIS — Z1389 Encounter for screening for other disorder: Secondary | ICD-10-CM

## 2019-05-25 DIAGNOSIS — R030 Elevated blood-pressure reading, without diagnosis of hypertension: Secondary | ICD-10-CM

## 2019-05-25 DIAGNOSIS — O09619 Supervision of young primigravida, unspecified trimester: Secondary | ICD-10-CM

## 2019-05-25 DIAGNOSIS — Z331 Pregnant state, incidental: Secondary | ICD-10-CM

## 2019-05-25 LAB — POCT URINALYSIS DIPSTICK OB
Blood, UA: NEGATIVE
Glucose, UA: NEGATIVE
Ketones, UA: NEGATIVE
Leukocytes, UA: NEGATIVE
Nitrite, UA: NEGATIVE
POC,PROTEIN,UA: NEGATIVE

## 2019-05-25 NOTE — Addendum Note (Signed)
Addended by: Octaviano Glow on: 05/25/2019 02:29 PM   Modules accepted: Orders

## 2019-05-25 NOTE — Progress Notes (Signed)
   NURSE VISIT- BLOOD PRESSURE CHECK  SUBJECTIVE:  Diana Schmitt is a 18 y.o. G1P0 female here for BP check. She is [redacted]w[redacted]d pregnant    HYPERTENSION ROS:  Pregnant/postpartum:  . Severe headaches that don't go away with tylenol/other medicines: No  . Visual changes (seeing spots/double/blurred vision) No  . Severe pain under right breast breast or in center of upper chest No  . Severe nausea/vomiting No  . Taking medicines as instructed not applicable  GYN patient: . Taking medicines as instructed not applicable . Headaches  No . Chest pain No . Shortness of breath No . Swelling in legs/ankles No  OBJECTIVE:  BP (!) 143/83 (BP Location: Left Arm, Patient Position: Sitting, Cuff Size: Normal)   Pulse (!) 127   LMP 10/19/2018 (Exact Date)   Appearance alert, well appearing, and in no distress and oriented to person, place, and time.  ASSESSMENT: Pregnancy [redacted]w[redacted]d  blood pressure check  PLAN: Discussed with Dr. Elonda Husky   Recommendations: check pre-e labs today   Follow-up: as scheduled   Alice Rieger  05/25/2019 2:27 PM

## 2019-05-26 LAB — COMPREHENSIVE METABOLIC PANEL
ALT: 9 IU/L (ref 0–32)
AST: 15 IU/L (ref 0–40)
Albumin/Globulin Ratio: 1.4 (ref 1.2–2.2)
Albumin: 3.8 g/dL — ABNORMAL LOW (ref 3.9–5.0)
Alkaline Phosphatase: 85 IU/L (ref 39–117)
BUN/Creatinine Ratio: 9 (ref 9–23)
BUN: 5 mg/dL — ABNORMAL LOW (ref 6–20)
Bilirubin Total: <0.2 mg/dL (ref 0.0–1.2)
CO2: 19 mmol/L — ABNORMAL LOW (ref 20–29)
Calcium: 9.8 mg/dL (ref 8.7–10.2)
Chloride: 104 mmol/L (ref 96–106)
Creatinine, Ser: 0.54 mg/dL — ABNORMAL LOW (ref 0.57–1.00)
GFR calc Af Amer: 158 mL/min/{1.73_m2} (ref >59)
GFR calc non Af Amer: 137 mL/min/{1.73_m2} (ref >59)
Globulin, Total: 2.8 g/dL (ref 1.5–4.5)
Glucose: 66 mg/dL (ref 65–99)
Potassium: 3.9 mmol/L (ref 3.5–5.2)
Sodium: 137 mmol/L (ref 134–144)
Total Protein: 6.6 g/dL (ref 6.0–8.5)

## 2019-05-26 LAB — CBC
Hematocrit: 30.9 % — ABNORMAL LOW (ref 34.0–46.6)
Hemoglobin: 10.3 g/dL — ABNORMAL LOW (ref 11.1–15.9)
MCH: 27.2 pg (ref 26.6–33.0)
MCHC: 33.3 g/dL (ref 31.5–35.7)
MCV: 82 fL (ref 79–97)
Platelets: 180 10*3/uL (ref 150–450)
RBC: 3.79 x10E6/uL (ref 3.77–5.28)
RDW: 13.3 % (ref 11.7–15.4)
WBC: 7.1 10*3/uL (ref 3.4–10.8)

## 2019-05-26 LAB — PROTEIN / CREATININE RATIO, URINE
Creatinine, Urine: 37.5 mg/dL
Protein, Ur: <4 mg/dL

## 2019-05-29 ENCOUNTER — Other Ambulatory Visit: Payer: Self-pay

## 2019-05-29 ENCOUNTER — Ambulatory Visit (INDEPENDENT_AMBULATORY_CARE_PROVIDER_SITE_OTHER): Payer: Medicaid Other | Admitting: *Deleted

## 2019-05-29 VITALS — BP 136/92 | HR 80

## 2019-05-29 DIAGNOSIS — Z1389 Encounter for screening for other disorder: Secondary | ICD-10-CM

## 2019-05-29 DIAGNOSIS — Z331 Pregnant state, incidental: Secondary | ICD-10-CM

## 2019-05-29 LAB — POCT URINALYSIS DIPSTICK OB
Blood, UA: NEGATIVE
Glucose, UA: NEGATIVE
Ketones, UA: NEGATIVE
Leukocytes, UA: NEGATIVE
Nitrite, UA: NEGATIVE
POC,PROTEIN,UA: NEGATIVE

## 2019-05-29 MED ORDER — FERROUS SULFATE 325 (65 FE) MG PO TABS: 325.0000 mg | ORAL_TABLET | Freq: Two times a day (BID) | ORAL | 3 refills | Status: DC

## 2019-05-29 NOTE — Addendum Note (Signed)
Addended by: Wells Guiles R on: 05/29/2019 02:10 PM   Modules accepted: Orders

## 2019-05-29 NOTE — Progress Notes (Addendum)
   NURSE VISIT- BLOOD PRESSURE CHECK  SUBJECTIVE:  Diana Schmitt is a 18 y.o. G1P0 female here for BP check. She is [redacted]w[redacted]d pregnant    HYPERTENSION ROS:  Pregnant/postpartum:  . Severe headaches that don't go away with tylenol/other medicines: No  . Visual changes (seeing spots/double/blurred vision) No  . Severe pain under right breast breast or in center of upper chest No  . Severe nausea/vomiting No  . Taking medicines as instructed not applicable   OBJECTIVE:  BP 136/90 (BP Location: Right Arm, Patient Position: Sitting, Cuff Size: Normal)   Pulse 80   LMP 10/19/2018 (Exact Date)   Appearance alert, well appearing, and in no distress.  ASSESSMENT: Pregnancy [redacted]w[redacted]d  blood pressure check  PLAN: Discussed with [name], CNM, [facility]   Recommendations: BPP with 11/19 HROB, then weekly BPP with HROB visit. Cancel 11/24 BP check.   Follow-up: as scheduled   Daizy Outen, Celene Squibb  05/29/2019 10:26 AM   Chart reviewed for nurse visit. Agree with plan of care. Dx GHTN. Start weekly BPPs. Had neg pre-e labs 05/25/19. Rx Fe BID for hgb 10.3.  Roma Schanz, [place] 05/29/2019 2:09 PM

## 2019-06-01 ENCOUNTER — Encounter: Payer: Self-pay | Admitting: *Deleted

## 2019-06-01 ENCOUNTER — Other Ambulatory Visit: Payer: Self-pay | Admitting: *Deleted

## 2019-06-01 DIAGNOSIS — O09619 Supervision of young primigravida, unspecified trimester: Secondary | ICD-10-CM

## 2019-06-01 DIAGNOSIS — O365990 Maternal care for other known or suspected poor fetal growth, unspecified trimester, not applicable or unspecified: Secondary | ICD-10-CM

## 2019-06-04 ENCOUNTER — Other Ambulatory Visit: Payer: Self-pay

## 2019-06-04 ENCOUNTER — Ambulatory Visit (INDEPENDENT_AMBULATORY_CARE_PROVIDER_SITE_OTHER): Payer: Medicaid Other | Admitting: Obstetrics & Gynecology

## 2019-06-04 ENCOUNTER — Ambulatory Visit (INDEPENDENT_AMBULATORY_CARE_PROVIDER_SITE_OTHER): Payer: Medicaid Other

## 2019-06-04 ENCOUNTER — Encounter: Payer: Self-pay | Admitting: Obstetrics & Gynecology

## 2019-06-04 VITALS — BP 139/85 | HR 107 | Wt 170.0 lb

## 2019-06-04 DIAGNOSIS — O365930 Maternal care for other known or suspected poor fetal growth, third trimester, not applicable or unspecified: Secondary | ICD-10-CM

## 2019-06-04 DIAGNOSIS — Z3A29 29 weeks gestation of pregnancy: Secondary | ICD-10-CM

## 2019-06-04 DIAGNOSIS — Z362 Encounter for other antenatal screening follow-up: Secondary | ICD-10-CM

## 2019-06-04 DIAGNOSIS — Z331 Pregnant state, incidental: Secondary | ICD-10-CM

## 2019-06-04 DIAGNOSIS — O09613 Supervision of young primigravida, third trimester: Secondary | ICD-10-CM

## 2019-06-04 DIAGNOSIS — O09619 Supervision of young primigravida, unspecified trimester: Secondary | ICD-10-CM

## 2019-06-04 DIAGNOSIS — O99212 Obesity complicating pregnancy, second trimester: Secondary | ICD-10-CM

## 2019-06-04 DIAGNOSIS — O365990 Maternal care for other known or suspected poor fetal growth, unspecified trimester, not applicable or unspecified: Secondary | ICD-10-CM

## 2019-06-04 DIAGNOSIS — O133 Gestational [pregnancy-induced] hypertension without significant proteinuria, third trimester: Secondary | ICD-10-CM

## 2019-06-04 DIAGNOSIS — Z1389 Encounter for screening for other disorder: Secondary | ICD-10-CM

## 2019-06-04 LAB — POCT URINALYSIS DIPSTICK OB
Blood, UA: NEGATIVE
Glucose, UA: NEGATIVE
Ketones, UA: NEGATIVE
Leukocytes, UA: NEGATIVE
Nitrite, UA: NEGATIVE
POC,PROTEIN,UA: NEGATIVE

## 2019-06-04 NOTE — Progress Notes (Signed)
[place] 10+2 wks,cephalic,fhr 111 bpm,BPP 7/3,VA 2.7 cm,posterior placenta gr 1,BPP 8/8,AFI 10.6 cm,normal ovaries,RI .76,.74,.75=89.8%,EFW 1192 g 10.4%

## 2019-06-04 NOTE — Progress Notes (Signed)
   HIGH-RISK PREGNANCY VISIT Patient name: Diana Schmitt MRN [id number]  Date of birth: Dec 06, 2000 Chief Complaint:   Routine Prenatal Visit  History of Present Illness:   Diana Schmitt is a 18 y.o. G1P0 female at [redacted]w[redacted]d with an Estimated Date of Delivery: 08/18/19 being seen today for ongoing management of a high-risk pregnancy complicated by fetal growth restriciton and gestational hypertension.  Today she reports heartburn. Contractions: Not present. Vag. Bleeding: None.  Movement: Present. denies leaking of fluid.  Review of Systems:   Pertinent items are noted in HPI Denies abnormal vaginal discharge w/ itching/odor/irritation, headaches, visual changes, shortness of breath, chest pain, abdominal pain, severe nausea/vomiting, or problems with urination or bowel movements unless otherwise stated above. Pertinent History Reviewed:  Reviewed past medical,surgical, social, obstetrical and family history.  Reviewed problem list, medications and allergies. Physical Assessment:   Vitals:   06/04/19 0910  BP: 139/85  Pulse: (!) 107  Weight: 170 lb (77.1 kg)  Body mass index is 33.2 kg/m.           Physical Examination:   General appearance: alert, well appearing, and in no distress  Mental status: alert, oriented to person, place, and time  Skin: warm & dry   Extremities: Edema: None    Cardiovascular: normal heart rate noted  Respiratory: normal respiratory effort, no distress  Abdomen: gravid, soft, non-tender  Pelvic: Cervical exam deferred         Fetal Status:     Movement: Present    Fetal Surveillance Testing today: BPP 8/8 with borderline elevated Dopplers   Chaperone: n/a    Results for orders placed or performed in visit on 06/04/19 (from the past 24 hour(s))  POC Urinalysis Dipstick OB   Collection Time: 06/04/19  9:13 AM  Result Value Ref Range   Color, UA     Clarity, UA     Glucose, UA Negative Negative   Bilirubin, UA     Ketones, UA neg    Spec Grav, UA     Blood,  UA neg    pH, UA     POC,PROTEIN,UA Negative Negative, Trace, Small (1+), Moderate (2+), Large (3+), 4+   Urobilinogen, UA     Nitrite, UA neg    Leukocytes, UA Negative Negative   Appearance     Odor      Assessment & Plan:  1) High-risk pregnancy G1P0 at [redacted]w[redacted]d with an Estimated Date of Delivery: 08/18/19   2) FGR, stable, improved growth percentile, Dopplers remain high but good EDF noted  3) GHTN, stable, no meds at this pointed needed  Meds: No orders of the defined types were placed in this encounter.   Labs/procedures today: songram  Treatment Plan:  Weekly BPP, twice weekly surveillance at 32 weeks IOL 37 weeks or as clinically indicated  Reviewed: Preterm labor symptoms and general obstetric precautions including but not limited to vaginal bleeding, contractions, leaking of fluid and fetal movement were reviewed in detail with the patient.  All questions were answered. Has home bp cuff. Rx faxed to . Check bp weekly, let us know if >140/90.   Follow-up: No follow-ups on file.  Orders Placed This Encounter  Procedures  . POC Urinalysis Dipstick OB   Florian Buff  06/04/2019 9:38 AM

## 2019-06-09 ENCOUNTER — Ambulatory Visit (HOSPITAL_COMMUNITY)
Admission: RE | Admit: 2019-06-09 | Discharge: 2019-06-09 | Disposition: A | Discharge status: Home / Self Care | Payer: Medicaid Other | Source: Ambulatory Visit | Attending: Obstetrics and Gynecology | Admitting: Obstetrics and Gynecology

## 2019-06-09 ENCOUNTER — Other Ambulatory Visit: Payer: Self-pay

## 2019-06-09 DIAGNOSIS — Z3A30 30 weeks gestation of pregnancy: Secondary | ICD-10-CM

## 2019-06-09 DIAGNOSIS — O365990 Maternal care for other known or suspected poor fetal growth, unspecified trimester, not applicable or unspecified: Secondary | ICD-10-CM | POA: Diagnosis not present

## 2019-06-09 DIAGNOSIS — O365930 Maternal care for other known or suspected poor fetal growth, third trimester, not applicable or unspecified: Secondary | ICD-10-CM

## 2019-06-09 DIAGNOSIS — O09619 Supervision of young primigravida, unspecified trimester: Secondary | ICD-10-CM

## 2019-06-15 ENCOUNTER — Ambulatory Visit (INDEPENDENT_AMBULATORY_CARE_PROVIDER_SITE_OTHER): Payer: Medicaid Other

## 2019-06-15 ENCOUNTER — Ambulatory Visit (INDEPENDENT_AMBULATORY_CARE_PROVIDER_SITE_OTHER): Payer: Medicaid Other | Admitting: Obstetrics & Gynecology

## 2019-06-15 ENCOUNTER — Encounter: Payer: Self-pay | Admitting: Obstetrics & Gynecology

## 2019-06-15 ENCOUNTER — Other Ambulatory Visit: Payer: Self-pay

## 2019-06-15 VITALS — BP 131/89 | HR 119 | Wt 172.0 lb

## 2019-06-15 DIAGNOSIS — Z3A30 30 weeks gestation of pregnancy: Secondary | ICD-10-CM | POA: Diagnosis not present

## 2019-06-15 DIAGNOSIS — O09613 Supervision of young primigravida, third trimester: Secondary | ICD-10-CM

## 2019-06-15 DIAGNOSIS — Z362 Encounter for other antenatal screening follow-up: Secondary | ICD-10-CM

## 2019-06-15 DIAGNOSIS — O99212 Obesity complicating pregnancy, second trimester: Secondary | ICD-10-CM

## 2019-06-15 DIAGNOSIS — O365930 Maternal care for other known or suspected poor fetal growth, third trimester, not applicable or unspecified: Secondary | ICD-10-CM | POA: Diagnosis not present

## 2019-06-15 DIAGNOSIS — Z1389 Encounter for screening for other disorder: Secondary | ICD-10-CM

## 2019-06-15 DIAGNOSIS — O365990 Maternal care for other known or suspected poor fetal growth, unspecified trimester, not applicable or unspecified: Secondary | ICD-10-CM

## 2019-06-15 DIAGNOSIS — O09619 Supervision of young primigravida, unspecified trimester: Secondary | ICD-10-CM

## 2019-06-15 DIAGNOSIS — Z331 Pregnant state, incidental: Secondary | ICD-10-CM

## 2019-06-15 LAB — POCT URINALYSIS DIPSTICK OB
Blood, UA: NEGATIVE
Glucose, UA: NEGATIVE
Leukocytes, UA: NEGATIVE
Nitrite, UA: NEGATIVE

## 2019-06-15 NOTE — Progress Notes (Signed)
   HIGH-RISK PREGNANCY VISIT Patient name: Diana Schmitt MRN [id number]  Date of birth: May 08, 2001 Chief Complaint:   High Risk Gestation (U/S)  History of Present Illness:   Diana Schmitt is a 18 y.o. G1P0 female at [redacted]w[redacted]d with an Estimated Date of Delivery: 08/18/19 being seen today for ongoing management of a high-risk pregnancy complicated by Gestational hypertension and fetal growth restriciton, 7.3% with normal EDF.  Today she reports no complaints. Contractions: Not present.  .  Movement: Present. denies leaking of fluid.  Review of Systems:   Pertinent items are noted in HPI Denies abnormal vaginal discharge w/ itching/odor/irritation, headaches, visual changes, shortness of breath, chest pain, abdominal pain, severe nausea/vomiting, or problems with urination or bowel movements unless otherwise stated above. Pertinent History Reviewed:  Reviewed past medical,surgical, social, obstetrical and family history.  Reviewed problem list, medications and allergies. Physical Assessment:   Vitals:   06/15/19 1405  BP: 131/89  Pulse: (!) 119  Weight: 172 lb (78 kg)  Body mass index is 33.59 kg/m.           Physical Examination:   General appearance: alert, well appearing, and in no distress  Mental status: alert, oriented to person, place, and time  Skin: warm & dry   Extremities: Edema: None    Cardiovascular: normal heart rate noted  Respiratory: normal respiratory effort, no distress  Abdomen: gravid, soft, non-tender  Pelvic: Cervical exam deferred         Fetal Status: Fetal Heart Rate (bpm): 144 Fundal Height: 30 cm Movement: Present    Fetal Surveillance Testing today: sonogram    Chaperone: n/a    Results for orders placed or performed in visit on 06/15/19 (from the past 24 hour(s))  POC Urinalysis Dipstick OB   Collection Time: 06/15/19  2:19 PM  Result Value Ref Range   Color, UA     Clarity, UA     Glucose, UA Negative Negative   Bilirubin, UA     Ketones, UA large    Spec Grav, UA     Blood, UA neg    pH, UA     POC,PROTEIN,UA Trace Negative, Trace, Small (1+), Moderate (2+), Large (3+), 4+   Urobilinogen, UA     Nitrite, UA neg    Leukocytes, UA Negative Negative   Appearance     Odor      Assessment & Plan:  1) High-risk pregnancy G1P0 at [redacted]w[redacted]d with an Estimated Date of Delivery: 08/18/19   2) GHTN, stable  3) FGR, stable  Meds: No orders of the defined types were placed in this encounter.   Labs/procedures today: sonogram  Treatment Plan:  Twice weekly surveillance IOL 37 weeks or as clinically indicated  Reviewed: Preterm labor symptoms and general obstetric precautions including but not limited to vaginal bleeding, contractions, leaking of fluid and fetal movement were reviewed in detail with the patient.  All questions were answered. Has Rx home bp cuff.  Check bp weekly, let us know if >140/90.   Follow-up: Return in about 1 week (around 06/22/2019) for BPP/sono, HROB, ( make BPP sonograms, HROB Monday, NST nure visit Thursdays).  Orders Placed This Encounter  Procedures  . POC Urinalysis Dipstick OB   Florian Buff  06/15/2019 2:39 PM

## 2019-06-15 NOTE — Progress Notes (Signed)
[place] 22+9 wks,cephalic,posterior placenta gr 2,afi 11.6 cm,BPP 8/8,FHR 144 BPM,RI .70,.74,.67=83%,EFW 1391 g 7.3%

## 2019-06-24 ENCOUNTER — Other Ambulatory Visit: Payer: Self-pay

## 2019-06-24 ENCOUNTER — Ambulatory Visit (INDEPENDENT_AMBULATORY_CARE_PROVIDER_SITE_OTHER): Payer: Medicaid Other

## 2019-06-24 ENCOUNTER — Ambulatory Visit (INDEPENDENT_AMBULATORY_CARE_PROVIDER_SITE_OTHER): Payer: Medicaid Other | Admitting: Obstetrics and Gynecology

## 2019-06-24 VITALS — BP 127/83 | HR 113 | Wt 172.2 lb

## 2019-06-24 DIAGNOSIS — O365930 Maternal care for other known or suspected poor fetal growth, third trimester, not applicable or unspecified: Secondary | ICD-10-CM

## 2019-06-24 DIAGNOSIS — O99213 Obesity complicating pregnancy, third trimester: Secondary | ICD-10-CM

## 2019-06-24 DIAGNOSIS — Z3A32 32 weeks gestation of pregnancy: Secondary | ICD-10-CM | POA: Diagnosis not present

## 2019-06-24 DIAGNOSIS — O09619 Supervision of young primigravida, unspecified trimester: Secondary | ICD-10-CM

## 2019-06-24 DIAGNOSIS — O09613 Supervision of young primigravida, third trimester: Secondary | ICD-10-CM

## 2019-06-24 DIAGNOSIS — Z1389 Encounter for screening for other disorder: Secondary | ICD-10-CM

## 2019-06-24 DIAGNOSIS — O365990 Maternal care for other known or suspected poor fetal growth, unspecified trimester, not applicable or unspecified: Secondary | ICD-10-CM

## 2019-06-24 DIAGNOSIS — Z3483 Encounter for supervision of other normal pregnancy, third trimester: Secondary | ICD-10-CM

## 2019-06-24 DIAGNOSIS — Z331 Pregnant state, incidental: Secondary | ICD-10-CM

## 2019-06-24 LAB — POCT URINALYSIS DIPSTICK OB
Blood, UA: NEGATIVE
Glucose, UA: NEGATIVE
Ketones, UA: NEGATIVE
Leukocytes, UA: NEGATIVE
Nitrite, UA: NEGATIVE
POC,PROTEIN,UA: NEGATIVE

## 2019-06-24 NOTE — Progress Notes (Signed)
Patient ID: Diana Schmitt, female   DOB: [birth year], 18 y.o.   MRN: [id number]    [facility] PREGNANCY VISIT Patient name: Diana Schmitt MRN [id number]  Date of birth: [birth year] Chief Complaint:   Routine Prenatal Visit  History of Present Illness:   Diana Schmitt is a 18 y.o. G1P0 female at [redacted]w[redacted]d with an Estimated Date of Delivery: 08/18/19 being seen today for ongoing management of a high-risk pregnancy complicated by [facility] and fetal growth restriction at 7%overall growth at 30+wk scan. Today she reports no complaints. The baby is staying active.   Contractions: Not present. Vag. Bleeding: None.  Movement: Present. denies leaking of fluid.  Review of Systems:   Pertinent items are noted in HPI Denies abnormal vaginal discharge w/ itching/odor/irritation, headaches, visual changes, shortness of breath, chest pain, abdominal pain, severe nausea/vomiting, or problems with urination or bowel movements unless otherwise stated above. Pertinent History Reviewed:  Reviewed past medical,surgical, social, obstetrical and family history.  Reviewed problem list, medications and allergies. Physical Assessment:   Vitals:   06/24/19 0946 06/24/19 0948  BP: (!) 142/87 127/83  Pulse: 100 (!) 113  Weight: 172 lb 3.2 oz (78.1 kg)   Body mass index is 33.63 kg/m.           Physical Examination:   General appearance: alert, well appearing, and in no distress and oriented to person, place, and time  Mental status: alert, oriented to person, place, and time, normal mood, behavior, speech, dress, motor activity, and thought processes, affect appropriate to mood  Skin: warm & dry   Extremities:      Cardiovascular: normal heart rate noted  Respiratory: normal respiratory effort, no distress  Abdomen: gravid, soft, non-tender FH 32cm  Pelvic: Cervical exam deferred         Fetal Status: Fetal Heart Rate (bpm): 133 u/s Fundal Height: 32 cm Movement: Present    Fetal Surveillance Testing today: [place] 20+0  wks,cephalic,fhr 932 bpm,posterior placenta gr 2,afi 11 cm,RI .70,.71,.78,.69=92%,S/D 3.68=92%,elevated UAD with EDF,BPP 8/8 reports personally reviewed and show EDF present.  Results for orders placed or performed in visit on 06/24/19 (from the past 24 hour(s))  POC Urinalysis Dipstick OB   Collection Time: 06/24/19  9:43 AM  Result Value Ref Range   Color, UA     Clarity, UA     Glucose, UA Negative Negative   Bilirubin, UA     Ketones, UA n    Spec Grav, UA     Blood, UA n    pH, UA     POC,PROTEIN,UA Negative Negative, Trace, Small (1+), Moderate (2+), Large (3+), 4+   Urobilinogen, UA     Nitrite, UA n    Leukocytes, UA Negative Negative   Appearance     Odor      Assessment & Plan:  1) High-risk pregnancy G1P0 at [redacted]w[redacted]d with an Estimated Date of Delivery: 08/18/19   2) GHTN, stable  3) FGR, EFW 7% on U/S today  Meds: No orders of the defined types were placed in this encounter.  Labs/procedures today: none  Treatment Plan: Weekly BPPs going forward.  Reviewed: Preterm labor symptoms and general obstetric precautions including but not limited to vaginal bleeding, contractions, leaking of fluid and fetal movement were reviewed in detail with the patient.  All questions were answered. Has home bp cuff. Check bp weekly, let us know if >140/90.   Follow-up: Return in about 2 weeks (around 07/08/2019) for [place].  Orders Placed This Encounter  Procedures  POC Urinalysis Dipstick OB   By signing my name below, I, Pietro Cassis, attest that this documentation has been prepared under the direction and in the presence of Tilda Burrow, MD. Electronically Signed: Pietro Cassis, Medical Scribe. 06/24/19. 10:35 AM.  I personally performed the services described in this documentation, which was SCRIBED in my presence. The recorded information has been reviewed and considered accurate. It has been edited as necessary during review. Tilda Burrow, MD

## 2019-06-24 NOTE — Progress Notes (Signed)
[place] 40+3 wks,cephalic,fhr 524 bpm,posterior placenta gr 2,afi 11 cm,RI .70,.71,.78,.69=92%,S/D 3.68=92%,elevated UAD with EDF,BPP 8/8

## 2019-07-01 ENCOUNTER — Other Ambulatory Visit: Payer: Medicaid Other

## 2019-07-01 ENCOUNTER — Encounter: Payer: Medicaid Other | Admitting: Obstetrics and Gynecology

## 2019-07-03 ENCOUNTER — Ambulatory Visit (INDEPENDENT_AMBULATORY_CARE_PROVIDER_SITE_OTHER): Payer: Medicaid Other

## 2019-07-03 ENCOUNTER — Ambulatory Visit (INDEPENDENT_AMBULATORY_CARE_PROVIDER_SITE_OTHER): Payer: Medicaid Other | Admitting: Obstetrics & Gynecology

## 2019-07-03 ENCOUNTER — Other Ambulatory Visit: Payer: Self-pay

## 2019-07-03 ENCOUNTER — Encounter: Payer: Self-pay | Admitting: Obstetrics & Gynecology

## 2019-07-03 VITALS — BP 132/85 | HR 111 | Wt 175.0 lb

## 2019-07-03 DIAGNOSIS — O99213 Obesity complicating pregnancy, third trimester: Secondary | ICD-10-CM

## 2019-07-03 DIAGNOSIS — O09619 Supervision of young primigravida, unspecified trimester: Secondary | ICD-10-CM

## 2019-07-03 DIAGNOSIS — O09613 Supervision of young primigravida, third trimester: Secondary | ICD-10-CM | POA: Diagnosis not present

## 2019-07-03 DIAGNOSIS — O365930 Maternal care for other known or suspected poor fetal growth, third trimester, not applicable or unspecified: Secondary | ICD-10-CM | POA: Diagnosis not present

## 2019-07-03 DIAGNOSIS — O365990 Maternal care for other known or suspected poor fetal growth, unspecified trimester, not applicable or unspecified: Secondary | ICD-10-CM

## 2019-07-03 DIAGNOSIS — Z3A33 33 weeks gestation of pregnancy: Secondary | ICD-10-CM

## 2019-07-03 DIAGNOSIS — O133 Gestational [pregnancy-induced] hypertension without significant proteinuria, third trimester: Secondary | ICD-10-CM

## 2019-07-03 DIAGNOSIS — Z1389 Encounter for screening for other disorder: Secondary | ICD-10-CM

## 2019-07-03 DIAGNOSIS — O365130 Maternal care for known or suspected placental insufficiency, third trimester, not applicable or unspecified: Secondary | ICD-10-CM

## 2019-07-03 DIAGNOSIS — Z331 Pregnant state, incidental: Secondary | ICD-10-CM

## 2019-07-03 LAB — POCT URINALYSIS DIPSTICK
Blood, UA: NEGATIVE
Glucose, UA: NEGATIVE
Ketones, UA: NEGATIVE
Leukocytes, UA: NEGATIVE
Nitrite, UA: NEGATIVE
Protein, UA: NEGATIVE

## 2019-07-03 NOTE — Progress Notes (Signed)
   HIGH-RISK PREGNANCY VISIT Patient name: Diana Schmitt MRN [id number]  Date of birth: 2000-11-09 Chief Complaint:   Routine Prenatal Visit  History of Present Illness:   Diana Schmitt is a 18 y.o. G1P0 female at [redacted]w[redacted]d with an Estimated Date of Delivery: 08/18/19 being seen today for ongoing management of a high-risk pregnancy complicated by FGR, GHTN.  Today she reports no complaints. Contractions: Not present. Vag. Bleeding: None.  Movement: Present. denies leaking of fluid.  Review of Systems:   Pertinent items are noted in HPI Denies abnormal vaginal discharge w/ itching/odor/irritation, headaches, visual changes, shortness of breath, chest pain, abdominal pain, severe nausea/vomiting, or problems with urination or bowel movements unless otherwise stated above. Pertinent History Reviewed:  Reviewed past medical,surgical, social, obstetrical and family history.  Reviewed problem list, medications and allergies. Physical Assessment:   Vitals:   07/03/19 1126 07/03/19 1132  BP: (!) 139/93 132/85  Pulse: (!) 105 (!) 111  Weight: 175 lb (79.4 kg)   Body mass index is 34.18 kg/m.           Physical Examination:   General appearance: alert, well appearing, and in no distress  Mental status: alert, oriented to person, place, and time  Skin: warm & dry   Extremities: Edema: None    Cardiovascular: normal heart rate noted  Respiratory: normal respiratory effort, no distress  Abdomen: gravid, soft, non-tender  Pelvic: Cervical exam deferred         Fetal Status:     Movement: Present    Fetal Surveillance Testing today: BPP 8/8 wit normal Dopplers   Chaperone: n/a    Results for orders placed or performed in visit on 07/03/19 (from the past 24 hour(s))  POCT Urinalysis Dipstick   Collection Time: 07/03/19 11:31 AM  Result Value Ref Range   Color, UA     Clarity, UA     Glucose, UA Negative Negative   Bilirubin, UA     Ketones, UA neg    Spec Grav, UA     Blood, UA neg    pH, UA       Protein, UA Negative Negative   Urobilinogen, UA     Nitrite, UA neg    Leukocytes, UA Negative Negative   Appearance     Odor      Assessment & Plan:  1) High-risk pregnancy G1P0 at [redacted]w[redacted]d with an Estimated Date of Delivery: 08/18/19   2) FGR, stable, twice weekly testing, sonogram alternating with NST, next week since it's Christmas, NST Wednesday and keep appts for the next week  3) GHTN, stable  Meds: No orders of the defined types were placed in this encounter.   Labs/procedures today: sonogram  Treatment Plan:  Twice weekly surveillance, sonogram alternating with NST, induction at 37 weeks or as clinically indicated  Reviewed: Preterm labor symptoms and general obstetric precautions including but not limited to vaginal bleeding, contractions, leaking of fluid and fetal movement were reviewed in detail with the patient.  All questions were answered. Has home bp cuff. Rx faxed to . Check bp weekly, let us know if >140/90.   Follow-up: No follow-ups on file.  Orders Placed This Encounter  Procedures  . POCT Urinalysis Dipstick   Florian Buff  07/03/2019 11:58 AM

## 2019-07-03 NOTE — Progress Notes (Signed)
[place] 35+0 wks,cephalic,BPP 0/9,FGH 829 BPM,RI .71,.61,.73,.66=80%,AFI 14.7 cm,posterior placenta gr 2

## 2019-07-08 ENCOUNTER — Other Ambulatory Visit: Payer: Self-pay

## 2019-07-08 ENCOUNTER — Ambulatory Visit (INDEPENDENT_AMBULATORY_CARE_PROVIDER_SITE_OTHER): Payer: Medicaid Other | Admitting: Obstetrics and Gynecology

## 2019-07-08 ENCOUNTER — Encounter: Payer: Self-pay | Admitting: Obstetrics and Gynecology

## 2019-07-08 VITALS — BP 137/72 | HR 96 | Wt 174.2 lb

## 2019-07-08 DIAGNOSIS — O99213 Obesity complicating pregnancy, third trimester: Secondary | ICD-10-CM

## 2019-07-08 DIAGNOSIS — O09613 Supervision of young primigravida, third trimester: Secondary | ICD-10-CM

## 2019-07-08 DIAGNOSIS — O09619 Supervision of young primigravida, unspecified trimester: Secondary | ICD-10-CM

## 2019-07-08 DIAGNOSIS — Z1389 Encounter for screening for other disorder: Secondary | ICD-10-CM

## 2019-07-08 DIAGNOSIS — Z331 Pregnant state, incidental: Secondary | ICD-10-CM

## 2019-07-08 DIAGNOSIS — O365990 Maternal care for other known or suspected poor fetal growth, unspecified trimester, not applicable or unspecified: Secondary | ICD-10-CM

## 2019-07-08 DIAGNOSIS — Z3A34 34 weeks gestation of pregnancy: Secondary | ICD-10-CM

## 2019-07-08 DIAGNOSIS — O365930 Maternal care for other known or suspected poor fetal growth, third trimester, not applicable or unspecified: Secondary | ICD-10-CM

## 2019-07-08 LAB — POCT URINALYSIS DIPSTICK OB
Blood, UA: NEGATIVE
Glucose, UA: NEGATIVE
Leukocytes, UA: NEGATIVE
Nitrite, UA: NEGATIVE
POC,PROTEIN,UA: NEGATIVE

## 2019-07-08 NOTE — Addendum Note (Signed)
Addended by: Linton Rump on: 07/08/2019 12:51 PM   Modules accepted: Orders

## 2019-07-08 NOTE — Progress Notes (Signed)
Patient ID: Diana Schmitt, female   DOB: [birth year], 18 y.o.   MRN: [id number]    [facility] PREGNANCY VISIT Patient name: Diana Schmitt MRN [id number]  Date of birth: 28-Nov-2000 Chief Complaint:   High Risk Gestation (NST)  History of Present Illness:   [name] is a 18 y.o. G1P0 female at [redacted]w[redacted]d with an Estimated Date of Delivery: 08/18/19 being seen today for ongoing management of a high-risk pregnancy complicated by FGR 7%ile at 27wk, and GHTN,at 27 wk, now normal pressures,no meds.  Today she reports no complaints. Contractions: Not present. Vag. Bleeding: None.  Movement: Present. denies leaking of fluid.  Review of Systems:   Pertinent items are noted in HPI Denies abnormal vaginal discharge w/ itching/odor/irritation, headaches, visual changes, shortness of breath, chest pain, abdominal pain, severe nausea/vomiting, or problems with urination or bowel movements unless otherwise stated above. Pertinent History Reviewed:  Reviewed past medical,surgical, social, obstetrical and family history.  Reviewed problem list, medications and allergies. Physical Assessment:   Vitals:   07/08/19 1019  BP: 137/72  Pulse: 96  Weight: 174 lb 3.2 oz (79 kg)  Body mass index is 34.02 kg/m.           Physical Examination:   General appearance: alert, well appearing, and in no distress  Mental status: normal mood, behavior, speech, dress, motor activity, and thought processes, affect appropriate to mood  Skin: warm & dry   Extremities: Edema: Trace    Cardiovascular: normal heart rate noted  Respiratory: normal respiratory effort, no distress  Abdomen: gravid, soft, non-tender 34 cm does not appear small.  Pelvic: Cervical exam deferred         Fetal Status: Fetal Heart Rate (bpm): 135 nst Fundal Height: 33 cm Movement: Present    Fetal Surveillance Testing today: NST   Chaperone: Samul Dada    No results found for this or any previous visit (from the past 24 hour(s)).  Assessment & Plan:  1)  High-risk pregnancy G1P0 at 105w1d with an Estimated Date of Delivery: 08/18/19   2) FGR, 7% with BPD and HC  3% at 27 wk   3) GHTN, stable pressures at present.  Meds: No orders of the defined types were placed in this encounter.   Labs/procedures today: NST  Treatment Plan:  1 week, efw & bpp  Reviewed: Preterm labor symptoms and general obstetric precautions including but not limited to vaginal bleeding, contractions, leaking of fluid and fetal movement were reviewed in detail with the patient.  All questions were answered. Check bp weekly, let us know if >140/90.   Follow-up: Return in about 1 week (around 07/15/2019) for BPP & EFW.  Orders Placed This Encounter  Procedures  . POC Urinalysis Dipstick OB   By signing my name below, I, Samul Dada, attest that this documentation has been prepared under the direction and in the presence of Jonnie Kind, MD. Electronically Signed: [place]. 07/08/19. 10:53 AM.  I personally performed the services described in this documentation, which was SCRIBED in my presence. The recorded information has been reviewed and considered accurate. It has been edited as necessary during review. Jonnie Kind, MD

## 2019-07-14 ENCOUNTER — Other Ambulatory Visit: Payer: Self-pay | Admitting: Family Medicine

## 2019-07-14 DIAGNOSIS — O365990 Maternal care for other known or suspected poor fetal growth, unspecified trimester, not applicable or unspecified: Secondary | ICD-10-CM

## 2019-07-14 DIAGNOSIS — O09619 Supervision of young primigravida, unspecified trimester: Secondary | ICD-10-CM

## 2019-07-15 ENCOUNTER — Ambulatory Visit (INDEPENDENT_AMBULATORY_CARE_PROVIDER_SITE_OTHER): Payer: Medicaid Other

## 2019-07-15 ENCOUNTER — Encounter: Payer: Self-pay | Admitting: Obstetrics and Gynecology

## 2019-07-15 ENCOUNTER — Other Ambulatory Visit: Payer: Self-pay

## 2019-07-15 ENCOUNTER — Ambulatory Visit (INDEPENDENT_AMBULATORY_CARE_PROVIDER_SITE_OTHER): Payer: Medicaid Other | Admitting: Obstetrics and Gynecology

## 2019-07-15 VITALS — BP 143/91 | HR 112 | Wt 177.0 lb

## 2019-07-15 DIAGNOSIS — O365930 Maternal care for other known or suspected poor fetal growth, third trimester, not applicable or unspecified: Secondary | ICD-10-CM

## 2019-07-15 DIAGNOSIS — Z3A35 35 weeks gestation of pregnancy: Secondary | ICD-10-CM

## 2019-07-15 DIAGNOSIS — O365990 Maternal care for other known or suspected poor fetal growth, unspecified trimester, not applicable or unspecified: Secondary | ICD-10-CM

## 2019-07-15 DIAGNOSIS — O09613 Supervision of young primigravida, third trimester: Secondary | ICD-10-CM | POA: Diagnosis not present

## 2019-07-15 DIAGNOSIS — O09619 Supervision of young primigravida, unspecified trimester: Secondary | ICD-10-CM

## 2019-07-15 DIAGNOSIS — Z362 Encounter for other antenatal screening follow-up: Secondary | ICD-10-CM | POA: Diagnosis not present

## 2019-07-15 DIAGNOSIS — Z331 Pregnant state, incidental: Secondary | ICD-10-CM

## 2019-07-15 DIAGNOSIS — Z1389 Encounter for screening for other disorder: Secondary | ICD-10-CM

## 2019-07-15 LAB — POCT URINALYSIS DIPSTICK OB
Blood, UA: NEGATIVE
Glucose, UA: NEGATIVE
Ketones, UA: NEGATIVE
Leukocytes, UA: NEGATIVE
Nitrite, UA: NEGATIVE
POC,PROTEIN,UA: NEGATIVE

## 2019-07-15 MED ORDER — BLOOD PRESSURE CUFF MISC: 1.0000 | 0 refills | Status: DC

## 2019-07-15 NOTE — Progress Notes (Signed)
[place] 66+4 wks,cephalic,BPP 4/0,[id number] placenta gr 2,normal ovaries, afi 12.6 cm,RI .65,.61,.72=80% S/D 2.87=74%,EFW 2168 g 9.4%

## 2019-07-15 NOTE — Progress Notes (Signed)
Patient ID: Diana Schmitt, female   DOB: 2000-07-31, 18 y.o.   MRN: [id number]    [facility] PREGNANCY VISIT Patient name: Diana Schmitt MRN [id number]  Date of birth: 07-Feb-2001 Chief Complaint:   High Risk Gestation ([place] today)  History of Present Illness:   Diana Schmitt is a 18 y.o. G1P0 female at [redacted]w[redacted]d with an Estimated Date of Delivery: 08/18/19 being seen today for ongoing management of a high-risk pregnancy complicated by FGR 7%ile at 27wk, and GHTN,at 27 wk, now normal pressures, no meds. Today she reports no complaints. She has not been taking her Bps at home because she doesn't have a BP cuff. The baby has been staying active. Denies headaches and blurry vision. The pt has been watching videos about labor and delivery.   Contractions: Not present. Vag. Bleeding: None.  Movement: Present. denies leaking of fluid.  Review of Systems:   Pertinent items are noted in HPI Denies abnormal vaginal discharge w/ itching/odor/irritation, headaches, visual changes, shortness of breath, chest pain, abdominal pain, severe nausea/vomiting, or problems with urination or bowel movements unless otherwise stated above. Pertinent History Reviewed:  Reviewed past medical,surgical, social, obstetrical and family history.  Reviewed problem list, medications and allergies. Physical Assessment:   Vitals:   07/15/19 0957  BP: (!) 143/91  Pulse: (!) 112  Weight: 177 lb (80.3 kg)  Body mass index is 34.57 kg/m.           Physical Examination:   General appearance: alert, well appearing, and in no distress and oriented to person, place, and time  Mental status: alert, oriented to person, place, and time, normal mood, behavior, speech, dress, motor activity, and thought processes, affect appropriate to mood  Skin: warm & dry   Extremities: Edema: Trace    Cardiovascular: normal heart rate noted  Respiratory: normal respiratory effort, no distress  Abdomen: gravid, soft, non-tender  Pelvic: Cervical exam deferred          Fetal Status:   Fundal Height: 34 cm Movement: Present    Fetal Surveillance Testing today: [place] 73+2 wks,cephalic,BPP 2/0,[id number] placenta gr 2,normal ovaries, afi 12.6 cm,RI .65,.61,.72=80% S/D 2.87=74%,EFW 2168 g 9.4%  Results for orders placed or performed in visit on 07/15/19 (from the past 24 hour(s))  POC Urinalysis Dipstick OB   Collection Time: 07/15/19  9:57 AM  Result Value Ref Range   Color, UA     Clarity, UA     Glucose, UA Negative Negative   Bilirubin, UA     Ketones, UA neg    Spec Grav, UA     Blood, UA neg    pH, UA     POC,PROTEIN,UA Negative Negative, Trace, Small (1+), Moderate (2+), Large (3+), 4+   Urobilinogen, UA     Nitrite, UA neg    Leukocytes, UA Negative Negative   Appearance     Odor      Assessment & Plan:  1) High-risk pregnancy G1P0 at [redacted]w[redacted]d with an Estimated Date of Delivery: 08/18/19   2) FGR, 7% with BPD and HC  3% at 27 wk   Growth (GA weeks) Weekly BPP  BPP + NST Delivery  IUGR (MFM management)    MFM  EFW ?10th percentile  3 weeks At Dx  5 w (ACOG 75 to 58 w) for EFW less than 10th percentile  AC less than 10, but EFW is normal Q 3 to 4 weeks No No     3) GHTN, no severe pressures.. (143/91 today)  Meds: No  orders of the defined types were placed in this encounter.  Labs/procedures today: None  Treatment Plan: Begin twice weekly surveillance Tues/Fri, IOL at 37 wks  Reviewed: Preterm labor symptoms and general obstetric precautions including but not limited to vaginal bleeding, contractions, leaking of fluid and fetal movement were reviewed in detail with the patient.  All questions were answered. Needs home bp cuff. Check bp weekly, let us know if >140/90.   Follow-up: Return in about 6 days (around 07/21/2019) for U/S: BPP, HROB., GBS, cervix check BP cuff ordered at [facility]then switched to [facility].  Orders Placed This Encounter  Procedures  . POC Urinalysis Dipstick OB   By signing my name below, I,  Pietro Cassis, attest that this documentation has been prepared under the direction and in the presence of Tilda Burrow, MD. Electronically Signed: Pietro Cassis, Medical Scribe. 07/15/19. 10:27 AM.  I personally performed the services described in this documentation, which was SCRIBED in my presence. The recorded information has been reviewed and considered accurate. It has been edited as necessary during review. Tilda Burrow, MD

## 2019-07-15 NOTE — Patient Instructions (Signed)
CHILDBIRTH CLASSES ° °[facility] °Call to Register: [phone] or [phone] or Register Online: [email or web address] ° °THESE CLASSES FILL UP VERY QUICKLY, SO SIGN UP AS SOON AS YOU CAN!!! ° °*Please visit [facility] pregnancy website at [email or web address]* ° °Option 1: Birth & Baby Series °• This series of 3 weekly classes helps you and your labor partner prepare for childbirth at [facility]. °• Reviews newborn care, labor & birth, pain management, and comfort techniques °• Maternity Care Center Tour of [facility] is included. °• Cost: $60 per couple for insured or self-pay, $30 per couple for Medicaid ° °Option 2: Weekend Birth & Baby °• This class is a weekend version of our Birth & Baby series. It is designed for parents who have a difficult time fitting several weeks of classes into their schedule.  °• Maternity Care Center Tour of [facility] is included.  °• Friday 6:30pm-8:30pm, Saturday 9am-4pm °• Cost: $75 per couple for insured or self-pay, $30 per couple for Medicaid ° °Option 3: Natural Childbirth °• This series of 5 weekly classes is for expectant parents who want to learn and practice natural methods of coping with the process of labor and childbirth. °• Maternity Care Center Tour of [facility] is included. °• Cost: $75 per couple for insured or self-pay, $30 per couple for Medicaid ° °Option 4: Online Birth & Baby °• This online class offers you the freedom to complete a Birth & Baby series in the comfort of your own home. The flexibility of this option allows you to review sections at your own place, at times convenient to you and your support people.  °• Cost: $60 for 60 days of online access ° ° ° °Other Available Classes °Baby & Me °Enjoy this time to discuss newborn & infant parenting topics and family adjustment issues with other new mothers in a relaxed environment. Each week brings a new speaker or baby-centered activity. We encourage  mothers and their babies (birth to crawling) to join us every Thursday in the [facility] at 11:00 am. You are welcome to visit this group even if you haven't delivered yet! It's wonderful to make new friends early and watch other moms interact with their babies. No registration or fee.  ° °Big Brother/ Big Sister °Let your children share in the joy of a new brother or sister in this special class designed just for them. This class is designed for children ages 2-6, but any age is welcome. Please register each child individually. ° °Breastfeeding Support Group °This group is a mother-to-mother support circle where moms have the opportunity to share their breastfeeding experiences. A breastfeeding Support nurse is present for questions and concerns. Meets each Tuesday at 11:00 am. No fee or registration. ° °Breastfeeding Your Baby °Breastfeeding is a special time for mother and child. This class will help you feel ready to begin this important relationship. Your partner is encouraged to attend with you.  ° °Caring For Baby °This class is for expectant  and adoptive parents who want to learn and practice the most up-to-date newborn care for their babies. Register only the mom-to-be and your partner can come with you. (Note: This class is included in the Birth & Baby series and the Weekend Birth & Baby classes.) ° °Comfort Techniques & Tour °This 2-hour interactive class will provide you the opportunity to learn & practice hands-on techniques with your partner that can help relieve some discomfort of labor and encourage your baby to rotate toward   the best position for birth. A tour of the [facility] is included.  ° °Daddy Boot Camp °This course offers Dads-to-be the tools and knowledge needed to feel confident on their journey to becoming new fathers. ° °Grandparent Love °Expecting a grandbaby? Learn about the latest infant care and safety recommendation and ways to  support your own child as he or she transitions into the parenting role.  ° °Infant and Child CPR °Parents, grandparents, babysitters, and friends learn Cardio-Pulmonary Resuscitation skills for infants and children. Register each participant individually. (Note: This Family & Friends program does not offer certification.) ° °Marvelous Multiples °Expecting twins, triplets, or more? This class covers the differences in labor, birth, parenting, and breastfeeding issues that face multiples' parents. NICU tour is included.  ° °Mom Talk °This mom-led group offers support and connection to mothers as they journey through the adjustments and struggles of that sometimes overwhelming first year after the birth of a child. A member of our [facility] staff will be present to share resources and additional support if needed, as you care for yourself and baby. You are welcome to visit the group before you deliver! It's wonderful to meet new friends early and watch other moms interact with their babies. It's held at [facility] at 10:00 am each Tuesday morning and 6:00 pm each Thursday evening. Babies (birth to crawling) welcome. No registration or fee.  ° °Waterbirth Classes °Interested in a waterbirth? This informational class will help you discover whether waterbirth is the right fir for you.  ° °[facility] Virtual Maternity Tour °View a virtual tour of [facility]. In-person tours are available for participants of childbirth education classes.  ° °

## 2019-07-16 ENCOUNTER — Other Ambulatory Visit: Payer: Self-pay | Admitting: Obstetrics and Gynecology

## 2019-07-17 NOTE — L&D Delivery Note (Addendum)
OB/GYN Faculty Practice Delivery Note  Diana Schmitt is a 19 y.o. G1P1001 s/p induced vaginal at [redacted]w[redacted]d for IUGR (9.4%ILE AT 35+1.Marland Kitchen   GBS Status: Positive/-- (01/07 1400) Maximum Maternal Temperature: 37.9  Labor Progress: . Admitted for IOL labor . cytotec x 2, s/p FB . AROM 5h 12m prior to delivery with clear fluid  . Pitocin until complete dilation achieved.   Delivery Date/Time: 08/05/2019 at 0757 Delivery: Called to room and patient was complete and pushing. Head delivered ROA.  No nuchal cord present. . Shoulder and body delivered in usual fashion. Infant with spontaneous cry, placed skin to skin on mother's abdomen, dried and stimulated. Cord clamped x 2 after 1-minute delay, and cut by FOB under my direct supervision. Cord blood drawn. Placenta delivered spontaneously with gentle cord traction by medical student, Alessandra Grout under my direct supervision. Fundus firm with massage and Pitocin. Labia, perineum, vagina, and cervix inspected with no lacerations.  Placenta: spontaneous , intact  Complications: Gestational HTN, suspected Triple I with tachycardia to 122 and two maternal temperatures 37-38.0 and given ampicillin and gentamycin.  Lacerations: N/A EBL: 257 mL  Analgesia: Epidural anesthesia  Postpartum Planning Mom and baby to mother/baby.  . Lactation consult . Contraception undecided   . Circ    Infant: Viable female APGAR: 8 + 9  2339 g  Genia Hotter, M.D.  08/05/2019 10:47 AM   Attestation:  I confirm that I have verified the information documented in the resident's note and that I have also personally reperformed the physical exam and all medical decision making activities.   I was gloved and present for entire delivery SVD without incident No difficulty with shoulders No lacerations  Aviva Signs, CNM

## 2019-07-20 ENCOUNTER — Ambulatory Visit (INDEPENDENT_AMBULATORY_CARE_PROVIDER_SITE_OTHER): Payer: Medicaid Other | Admitting: *Deleted

## 2019-07-20 ENCOUNTER — Other Ambulatory Visit: Payer: Self-pay

## 2019-07-20 VITALS — BP 110/69 | HR 98 | Wt 176.4 lb

## 2019-07-20 DIAGNOSIS — R030 Elevated blood-pressure reading, without diagnosis of hypertension: Secondary | ICD-10-CM

## 2019-07-20 DIAGNOSIS — O09619 Supervision of young primigravida, unspecified trimester: Secondary | ICD-10-CM | POA: Diagnosis not present

## 2019-07-20 DIAGNOSIS — Z331 Pregnant state, incidental: Secondary | ICD-10-CM

## 2019-07-20 DIAGNOSIS — Z3A35 35 weeks gestation of pregnancy: Secondary | ICD-10-CM | POA: Diagnosis not present

## 2019-07-20 DIAGNOSIS — Z1389 Encounter for screening for other disorder: Secondary | ICD-10-CM

## 2019-07-20 LAB — POCT URINALYSIS DIPSTICK OB
Blood, UA: NEGATIVE
Glucose, UA: NEGATIVE
Ketones, UA: NEGATIVE
Leukocytes, UA: NEGATIVE
Nitrite, UA: NEGATIVE
POC,PROTEIN,UA: NEGATIVE

## 2019-07-20 NOTE — Progress Notes (Signed)
   NURSE VISIT- NST  SUBJECTIVE:  Diana Schmitt is a 19 y.o. G1P0 female at [redacted]w[redacted]d, here for a NST for pregnancy complicated by [facility].  She reports active fetal movement, contractions: none, vaginal bleeding: none, membranes: intact.   OBJECTIVE:  BP 110/69   Pulse 98   Wt 176 lb 6.4 oz (80 kg)   LMP 10/19/2018 (Exact Date)   BMI 34.45 kg/m   Appears well, no apparent distress  Results for orders placed or performed in visit on 07/20/19 (from the past 24 hour(s))  POC Urinalysis Dipstick OB   Collection Time: 07/20/19 10:19 AM  Result Value Ref Range   Color, UA     Clarity, UA     Glucose, UA Negative Negative   Bilirubin, UA     Ketones, UA neg    Spec Grav, UA     Blood, UA neg    pH, UA     POC,PROTEIN,UA Negative Negative, Trace, Small (1+), Moderate (2+), Large (3+), 4+   Urobilinogen, UA     Nitrite, UA neg    Leukocytes, UA Negative Negative   Appearance     Odor      NST: FHR baseline 135 bpm, Variability: moderate, Accelerations:present, Decelerations:  Absent= Cat 1/Reactive Toco: none   ASSESSMENT: G1P0 at [redacted]w[redacted]d with GHTN NST reactive  PLAN: EFM strip reviewed by Dr. Despina Hidden   Recommendations: keep next appointment as scheduled    Jobe Marker  07/20/2019 4:24 PM

## 2019-07-22 ENCOUNTER — Other Ambulatory Visit: Payer: Self-pay | Admitting: Family Medicine

## 2019-07-22 DIAGNOSIS — O365990 Maternal care for other known or suspected poor fetal growth, unspecified trimester, not applicable or unspecified: Secondary | ICD-10-CM

## 2019-07-22 DIAGNOSIS — O09619 Supervision of young primigravida, unspecified trimester: Secondary | ICD-10-CM

## 2019-07-23 ENCOUNTER — Ambulatory Visit (INDEPENDENT_AMBULATORY_CARE_PROVIDER_SITE_OTHER): Payer: Medicaid Other | Admitting: Advanced Practice Midwife

## 2019-07-23 ENCOUNTER — Other Ambulatory Visit (HOSPITAL_COMMUNITY)
Admission: RE | Admit: 2019-07-23 | Discharge: 2019-07-23 | Disposition: A | Discharge status: Home / Self Care | Payer: Medicaid Other | Source: Ambulatory Visit | Attending: Advanced Practice Midwife | Admitting: Advanced Practice Midwife

## 2019-07-23 ENCOUNTER — Other Ambulatory Visit: Payer: Self-pay

## 2019-07-23 ENCOUNTER — Ambulatory Visit (INDEPENDENT_AMBULATORY_CARE_PROVIDER_SITE_OTHER): Payer: Medicaid Other

## 2019-07-23 ENCOUNTER — Encounter: Payer: Self-pay | Admitting: Advanced Practice Midwife

## 2019-07-23 VITALS — BP 128/87 | HR 104

## 2019-07-23 DIAGNOSIS — O365930 Maternal care for other known or suspected poor fetal growth, third trimester, not applicable or unspecified: Secondary | ICD-10-CM | POA: Diagnosis not present

## 2019-07-23 DIAGNOSIS — Z3A36 36 weeks gestation of pregnancy: Secondary | ICD-10-CM

## 2019-07-23 DIAGNOSIS — Z331 Pregnant state, incidental: Secondary | ICD-10-CM | POA: Diagnosis not present

## 2019-07-23 DIAGNOSIS — O09613 Supervision of young primigravida, third trimester: Secondary | ICD-10-CM | POA: Diagnosis not present

## 2019-07-23 DIAGNOSIS — O09619 Supervision of young primigravida, unspecified trimester: Secondary | ICD-10-CM

## 2019-07-23 DIAGNOSIS — O0993 Supervision of high risk pregnancy, unspecified, third trimester: Secondary | ICD-10-CM

## 2019-07-23 DIAGNOSIS — Z1389 Encounter for screening for other disorder: Secondary | ICD-10-CM

## 2019-07-23 DIAGNOSIS — O365990 Maternal care for other known or suspected poor fetal growth, unspecified trimester, not applicable or unspecified: Secondary | ICD-10-CM

## 2019-07-23 DIAGNOSIS — O99213 Obesity complicating pregnancy, third trimester: Secondary | ICD-10-CM

## 2019-07-23 LAB — POCT URINALYSIS DIPSTICK OB
Blood, UA: NEGATIVE
Glucose, UA: NEGATIVE
Nitrite, UA: NEGATIVE

## 2019-07-23 NOTE — Progress Notes (Signed)
   Induction Assessment Scheduling Form: [facility]ax to [facility]:  [phone]  Diana Schmitt                                                                                   DOB:  04-Sep-2000                                                            MRN:  [id number]                                                                     Phone #:   [phone]                         Provider:  [facility]  GP:  G1P0                                                            Estimated Date of Delivery: 08/18/19  Dating Criteria: [place]    Medical Indications for induction:  IUGR/GHTN Admission Date/Time:  07/29/19 MN  Gestational age on admission:  37   There were no vitals filed for this visit. HIV:  Non Reactive (11/05 0925) GBS:   COLLECTED 1/7    Method of induction(proposed):  CYTOTEC   Scheduling Provider Signature:  Jacklyn Shell, CNM                                            Today's Date:  07/23/2019

## 2019-07-23 NOTE — Progress Notes (Signed)
[place] 36+2 wks,cephalic,posterior placenta gr 3,afi 13 cm,normal ovaries,fhr 146 bpm,RI .53,.56,.60,.59=60%,BPP 8/8

## 2019-07-23 NOTE — Progress Notes (Signed)
HIGH-RISK PREGNANCY VISIT Patient name: Diana Schmitt MRN [id number]  Date of birth: 02-16-2001 Chief Complaint:   Routine Prenatal Visit ([place])  History of Present Illness:   Diana Schmitt is a 19 y.o. G1P0 female at [redacted]w[redacted]d with an Estimated Date of Delivery: 08/18/19 being seen today for ongoing management of a high-risk pregnancy complicated by fetal growth restriction 9.2%  AND GHTN dx at 27 weeks Today she reports no complaints. Contractions: Not present. Vag. Bleeding: None.  Movement: Present. denies leaking of fluid.  Review of Systems:   Pertinent items are noted in HPI Denies abnormal vaginal discharge w/ itching/odor/irritation, headaches, visual changes, shortness of breath, chest pain, abdominal pain, severe nausea/vomiting, or problems with urination or bowel movements unless otherwise stated above. Pertinent History Reviewed:  Reviewed past medical,surgical, social, obstetrical and family history.  Reviewed problem list, medications and allergies. Physical Assessment:   Vitals:   07/23/19 1402  BP: 128/87  Pulse: (!) 104  There is no height or weight on file to calculate BMI.           Physical Examination:   General appearance: alert, well appearing, and in no distress, oriented to person, place, and time and normal appearing weight  Mental status: alert, oriented to person, place, and time  Skin: warm & dry   Extremities: Edema: None    Cardiovascular: normal heart rate noted  Respiratory: normal respiratory effort, no distress  Abdomen: gravid, soft, non-tender  Pelvic: Cervical exam performed  Dilation: Closed Effacement (%): Thick Station: -2  Fetal Status:     Movement: Present Presentation: Vertex  Fetal Surveillance Testing today: [place] 78+2 wks,cephalic,posterior placenta gr 3,afi 13 cm,normal ovaries,fhr 146 bpm,RI .53,.56,.60,.59=60%,BPP 8/8   Results for orders placed or performed in visit on 07/23/19 (from the past 24 hour(s))  POC Urinalysis Dipstick OB   Collection Time: 07/23/19  2:05 PM  Result Value Ref Range   Color, UA     Clarity, UA     Glucose, UA Negative Negative   Bilirubin, UA     Ketones, UA small    Spec Grav, UA     Blood, UA neg    pH, UA     POC,PROTEIN,UA Trace Negative, Trace, Small (1+), Moderate (2+), Large (3+), 4+   Urobilinogen, UA     Nitrite, UA neg    Leukocytes, UA Small (1+) (A) Negative   Appearance     Odor      Assessment & Plan:  1) High-risk pregnancy G1P0 at [redacted]w[redacted]d with an Estimated Date of Delivery: 08/18/19   2) GHTN, stable Treatment plan  IOL at 37 weeks  3) IUGR, stable   Meds: No orders of the defined types were placed in this encounter.   Labs/procedures today: GBS/GC/CHL, BPP/dopplers   Reviewed: Preterm labor symptoms and general obstetric precautions including but not limited to vaginal bleeding, contractions, leaking of fluid and fetal movement were reviewed in detail with the patient.  All questions were answered. Has home bp cuff. Check bp weekly, le  Follow-up: Cancel all but 1/11 appt. IOL [place] on 1/13 (orders in)  Future Appointments  Date Time Provider [place]  07/23/2019  2:50 PM Christin Fudge, CNM [facility]  07/27/2019 10:10 AM [facility] NURSE [facility]  07/29/2019 12:00 AM [facility] [place] [facility] None  07/30/2019  9:00 AM [facility] [place] [facility] None  07/30/2019  9:50 AM Jonnie Kind, MD [facility]  08/03/2019 10:10 AM [facility] NURSE [facility]  08/06/2019 10:30  AM [facility] [place] [facility] None  08/06/2019 11:30 AM Calvert Cantor, CNM [facility]    Orders Placed This Encounter  Procedures  . Culture, beta strep (group b only)  . POC Urinalysis Dipstick OB   Jacklyn Shell DNP, CNM 07/23/2019 2:35 PM

## 2019-07-23 NOTE — Patient Instructions (Signed)
If you are still pregnant on 07/28/19 at 11:45 pm (YES< PM!) ,  go to [employer] and [employer] at [facility], ([address]tr[address] in [place], [place]) to start your induction.  Eat a light meal before you come.  Daisey Must!!

## 2019-07-24 ENCOUNTER — Telehealth (HOSPITAL_COMMUNITY): Payer: Self-pay | Admitting: *Deleted

## 2019-07-24 NOTE — Telephone Encounter (Signed)
Preadmission screen  

## 2019-07-25 DIAGNOSIS — Z3490 Encounter for supervision of normal pregnancy, unspecified, unspecified trimester: Secondary | ICD-10-CM | POA: Diagnosis not present

## 2019-07-26 LAB — CULTURE, BETA STREP (GROUP B ONLY): Strep Gp B Culture: POSITIVE — AB

## 2019-07-27 ENCOUNTER — Other Ambulatory Visit (HOSPITAL_COMMUNITY)
Admission: RE | Admit: 2019-07-27 | Discharge: 2019-07-27 | Disposition: A | Discharge status: Home / Self Care | Payer: Medicaid Other | Source: Ambulatory Visit | Attending: Obstetrics & Gynecology | Admitting: Obstetrics & Gynecology

## 2019-07-27 ENCOUNTER — Other Ambulatory Visit: Payer: Self-pay

## 2019-07-27 ENCOUNTER — Ambulatory Visit (INDEPENDENT_AMBULATORY_CARE_PROVIDER_SITE_OTHER): Payer: Medicaid Other | Admitting: *Deleted

## 2019-07-27 ENCOUNTER — Other Ambulatory Visit (HOSPITAL_COMMUNITY): Payer: Medicaid Other

## 2019-07-27 VITALS — BP 127/72 | HR 98 | Wt 179.0 lb

## 2019-07-27 DIAGNOSIS — Z331 Pregnant state, incidental: Secondary | ICD-10-CM

## 2019-07-27 DIAGNOSIS — O09619 Supervision of young primigravida, unspecified trimester: Secondary | ICD-10-CM

## 2019-07-27 DIAGNOSIS — Z01812 Encounter for preprocedural laboratory examination: Secondary | ICD-10-CM | POA: Insufficient documentation

## 2019-07-27 DIAGNOSIS — Z20822 Contact with and (suspected) exposure to covid-19: Secondary | ICD-10-CM | POA: Diagnosis not present

## 2019-07-27 DIAGNOSIS — Z1389 Encounter for screening for other disorder: Secondary | ICD-10-CM

## 2019-07-27 LAB — POCT URINALYSIS DIPSTICK OB
Blood, UA: NEGATIVE
Glucose, UA: NEGATIVE
Ketones, UA: NEGATIVE
Leukocytes, UA: NEGATIVE
Nitrite, UA: NEGATIVE
POC,PROTEIN,UA: NEGATIVE

## 2019-07-27 LAB — CERVICOVAGINAL ANCILLARY ONLY
Chlamydia: NEGATIVE
Comment: NEGATIVE
Comment: NORMAL
Neisseria Gonorrhea: NEGATIVE

## 2019-07-27 LAB — SARS CORONAVIRUS 2 (TAT 6-24 HRS): SARS Coronavirus 2: NEGATIVE

## 2019-07-27 NOTE — Progress Notes (Signed)
   NURSE VISIT- NST  SUBJECTIVE:  Diana Schmitt is a 19 y.o. G1P0 female at [redacted]w[redacted]d, here for a NST for pregnancy complicated by [facility].  She reports active fetal movement, contractions: none, vaginal bleeding: none, membranes: intact.   OBJECTIVE:  BP 127/72   Pulse 98   Wt 179 lb (81.2 kg)   LMP 10/19/2018 (Exact Date)   BMI 34.96 kg/m   Appears well, no apparent distress  Results for orders placed or performed in visit on 07/27/19 (from the past 24 hour(s))  POC Urinalysis Dipstick OB   Collection Time: 07/27/19 10:29 AM  Result Value Ref Range   Color, UA     Clarity, UA     Glucose, UA Negative Negative   Bilirubin, UA     Ketones, UA n    Spec Grav, UA     Blood, UA n    pH, UA     POC,PROTEIN,UA Negative Negative, Trace, Small (1+), Moderate (2+), Large (3+), 4+   Urobilinogen, UA     Nitrite, UA n    Leukocytes, UA Negative Negative   Appearance     Odor      NST: FHR baseline  140 bpm, Variability: moderate, Accelerations:present, Decelerations:  Absent= Cat 1/Reactive Toco: none   ASSESSMENT: G1P0 at [redacted]w[redacted]d with GHTN NST reactive  PLAN: EFM strip reviewed by Cathie Beams, CNM   Recommendations: keep next appointment as scheduled    Stoney Bang  07/27/2019 12:27 PM

## 2019-07-29 ENCOUNTER — Inpatient Hospital Stay (HOSPITAL_COMMUNITY): Payer: Medicaid Other

## 2019-07-29 ENCOUNTER — Inpatient Hospital Stay (HOSPITAL_COMMUNITY): Payer: Medicaid Other | Admitting: Anesthesiology

## 2019-07-29 ENCOUNTER — Inpatient Hospital Stay (HOSPITAL_COMMUNITY)
Admission: AD | Admit: 2019-07-29 | Discharge: 2019-08-01 | DRG: 805 | Disposition: A | Discharge status: Home / Self Care | Payer: Medicaid Other | Attending: Obstetrics & Gynecology | Admitting: Obstetrics & Gynecology

## 2019-07-29 ENCOUNTER — Other Ambulatory Visit: Payer: Self-pay

## 2019-07-29 DIAGNOSIS — O411230 Chorioamnionitis, third trimester, not applicable or unspecified: Secondary | ICD-10-CM | POA: Diagnosis not present

## 2019-07-29 DIAGNOSIS — O99214 Obesity complicating childbirth: Secondary | ICD-10-CM | POA: Diagnosis present

## 2019-07-29 DIAGNOSIS — O134 Gestational [pregnancy-induced] hypertension without significant proteinuria, complicating childbirth: Secondary | ICD-10-CM | POA: Diagnosis present

## 2019-07-29 DIAGNOSIS — O365990 Maternal care for other known or suspected poor fetal growth, unspecified trimester, not applicable or unspecified: Secondary | ICD-10-CM | POA: Diagnosis present

## 2019-07-29 DIAGNOSIS — O99824 Streptococcus B carrier state complicating childbirth: Secondary | ICD-10-CM | POA: Diagnosis present

## 2019-07-29 DIAGNOSIS — Z3A37 37 weeks gestation of pregnancy: Secondary | ICD-10-CM

## 2019-07-29 DIAGNOSIS — O365930 Maternal care for other known or suspected poor fetal growth, third trimester, not applicable or unspecified: Principal | ICD-10-CM | POA: Diagnosis present

## 2019-07-29 DIAGNOSIS — E669 Obesity, unspecified: Secondary | ICD-10-CM | POA: Diagnosis present

## 2019-07-29 LAB — CBC
HCT: 33.9 % — ABNORMAL LOW (ref 36.0–46.0)
HCT: 33.2 % — ABNORMAL LOW (ref 36.0–46.0)
Hemoglobin: 10.9 g/dL — ABNORMAL LOW (ref 12.0–15.0)
Hemoglobin: 10.7 g/dL — ABNORMAL LOW (ref 12.0–15.0)
MCH: 27.2 pg (ref 26.0–34.0)
MCH: 27 pg (ref 26.0–34.0)
MCHC: 32.2 g/dL (ref 30.0–36.0)
MCHC: 32.2 g/dL (ref 30.0–36.0)
MCV: 84.5 fL (ref 80.0–100.0)
MCV: 83.8 fL (ref 80.0–100.0)
Platelets: 134 10*3/uL — ABNORMAL LOW (ref 150–400)
Platelets: 138 10*3/uL — ABNORMAL LOW (ref 150–400)
RBC: 4.01 MIL/uL (ref 3.87–5.11)
RBC: 3.96 MIL/uL (ref 3.87–5.11)
RDW: 13.5 % (ref 11.5–15.5)
RDW: 13.5 % (ref 11.5–15.5)
WBC: 5.2 10*3/uL (ref 4.0–10.5)
WBC: 8.8 10*3/uL (ref 4.0–10.5)
nRBC: 0 % (ref 0.0–0.2)
nRBC: 0 % (ref 0.0–0.2)

## 2019-07-29 LAB — COMPREHENSIVE METABOLIC PANEL
ALT: 9 U/L (ref 0–44)
AST: 18 U/L (ref 15–41)
Albumin: 2.9 g/dL — ABNORMAL LOW (ref 3.5–5.0)
Alkaline Phosphatase: 136 U/L — ABNORMAL HIGH (ref 38–126)
Anion gap: 10 (ref 5–15)
BUN: 5 mg/dL — ABNORMAL LOW (ref 6–20)
CO2: 19 mmol/L — ABNORMAL LOW (ref 22–32)
Calcium: 9.2 mg/dL (ref 8.9–10.3)
Chloride: 108 mmol/L (ref 98–111)
Creatinine, Ser: 0.62 mg/dL (ref 0.44–1.00)
GFR calc Af Amer: >60 mL/min (ref >60)
GFR calc non Af Amer: >60 mL/min (ref >60)
Glucose, Bld: 75 mg/dL (ref 70–99)
Potassium: 4 mmol/L (ref 3.5–5.1)
Sodium: 137 mmol/L (ref 135–145)
Total Bilirubin: 0.7 mg/dL (ref 0.3–1.2)
Total Protein: 6.4 g/dL — ABNORMAL LOW (ref 6.5–8.1)

## 2019-07-29 LAB — TYPE AND SCREEN
ABO/RH(D): O POS
Antibody Screen: NEGATIVE

## 2019-07-29 LAB — ABO/RH: ABO/RH(D): O POS

## 2019-07-29 LAB — PROTEIN / CREATININE RATIO, URINE
Creatinine, Urine: 75.82 mg/dL
Protein Creatinine Ratio: 0.17 mg/mg{Cre} — ABNORMAL HIGH (ref 0.00–0.15)
Total Protein, Urine: 13 mg/dL

## 2019-07-29 LAB — RPR: RPR Ser Ql: NONREACTIVE

## 2019-07-29 MED ORDER — PHENYLEPHRINE 40 MCG/ML (10ML) SYRINGE FOR IV PUSH (FOR BLOOD PRESSURE SUPPORT): 80.0000 ug | PREFILLED_SYRINGE | INTRAVENOUS | Status: DC | PRN

## 2019-07-29 MED ORDER — SODIUM CHLORIDE 0.9 % IV SOLN
2.0000 g | Freq: Four times a day (QID) | INTRAVENOUS | Status: DC
  Administered 2019-07-29 – 2019-07-30 (×2): 2 g via INTRAVENOUS
  Filled 2019-07-29 (×4): qty 2000

## 2019-07-29 MED ORDER — PENICILLIN G POT IN DEXTROSE 60000 UNIT/ML IV SOLN
3.0000 10*6.[IU] | INTRAVENOUS | Status: DC
  Administered 2019-07-29 (×5): 3 10*6.[IU] via INTRAVENOUS
  Filled 2019-07-29 (×6): qty 50

## 2019-07-29 MED ORDER — FENTANYL CITRATE (PF) 100 MCG/2ML IJ SOLN
50.0000 ug | INTRAMUSCULAR | Status: DC | PRN
  Administered 2019-07-29 (×2): 50 ug via INTRAVENOUS
  Administered 2019-07-29 (×2): 100 ug via INTRAVENOUS
  Filled 2019-07-29 (×4): qty 2

## 2019-07-29 MED ORDER — PENICILLIN G POTASSIUM 5000000 UNITS IJ SOLR: 3.0000 10*6.[IU] | INTRAMUSCULAR | Status: DC

## 2019-07-29 MED ORDER — MISOPROSTOL 25 MCG QUARTER TABLET
25.0000 ug | ORAL_TABLET | ORAL | Status: DC | PRN
  Filled 2019-07-29: qty 1

## 2019-07-29 MED ORDER — FENTANYL-BUPIVACAINE-NACL 0.5-0.125-0.9 MG/250ML-% EP SOLN
EPIDURAL | Status: AC
  Filled 2019-07-29: qty 250

## 2019-07-29 MED ORDER — ZOLPIDEM TARTRATE 5 MG PO TABS: 5.0000 mg | ORAL_TABLET | Freq: Every evening | ORAL | Status: DC | PRN

## 2019-07-29 MED ORDER — OXYTOCIN 40 UNITS IN NORMAL SALINE INFUSION - SIMPLE MED
1.0000 m[IU]/min | INTRAVENOUS | Status: DC
  Administered 2019-07-29: 2 m[IU]/min via INTRAVENOUS
  Administered 2019-07-30: 6 m[IU]/min via INTRAVENOUS
  Filled 2019-07-29: qty 1000

## 2019-07-29 MED ORDER — SODIUM CHLORIDE (PF) 0.9 % IJ SOLN
INTRAMUSCULAR | Status: DC | PRN
  Administered 2019-07-29: 12 mL/h via EPIDURAL

## 2019-07-29 MED ORDER — EPHEDRINE 5 MG/ML INJ: 10.0000 mg | INTRAVENOUS | Status: DC | PRN

## 2019-07-29 MED ORDER — LACTATED RINGERS IV SOLN: 500.0000 mL | INTRAVENOUS | Status: DC | PRN

## 2019-07-29 MED ORDER — SODIUM CHLORIDE 0.9 % IV SOLN
5.0000 10*6.[IU] | Freq: Once | INTRAVENOUS | Status: AC
  Administered 2019-07-29: 5 10*6.[IU] via INTRAVENOUS
  Filled 2019-07-29: qty 5

## 2019-07-29 MED ORDER — TERBUTALINE SULFATE 1 MG/ML IJ SOLN
0.2500 mg | Freq: Once | INTRAMUSCULAR | Status: AC | PRN
  Administered 2019-07-30: 0.25 mg via SUBCUTANEOUS
  Filled 2019-07-29: qty 1

## 2019-07-29 MED ORDER — SOD CITRATE-CITRIC ACID 500-334 MG/5ML PO SOLN
30.0000 mL | ORAL | Status: DC | PRN
  Administered 2019-07-29 (×2): 30 mL via ORAL
  Filled 2019-07-29 (×2): qty 30

## 2019-07-29 MED ORDER — FENTANYL-BUPIVACAINE-NACL 0.5-0.125-0.9 MG/250ML-% EP SOLN
12.0000 mL/h | EPIDURAL | Status: DC | PRN
  Filled 2019-07-29: qty 250

## 2019-07-29 MED ORDER — FLEET ENEMA 7-19 GM/118ML RE ENEM: 1.0000 | ENEMA | RECTAL | Status: DC | PRN

## 2019-07-29 MED ORDER — LACTATED RINGERS IV SOLN: INTRAVENOUS | Status: DC

## 2019-07-29 MED ORDER — PENICILLIN G POTASSIUM 5000000 UNITS IJ SOLR: 5.0000 10*6.[IU] | INTRAMUSCULAR | Status: DC

## 2019-07-29 MED ORDER — LIDOCAINE HCL (PF) 1 % IJ SOLN
INTRAMUSCULAR | Status: DC | PRN
  Administered 2019-07-29: 10 mL via EPIDURAL

## 2019-07-29 MED ORDER — MISOPROSTOL 50MCG HALF TABLET
50.0000 ug | ORAL_TABLET | ORAL | Status: DC | PRN
  Administered 2019-07-29 (×2): 50 ug via ORAL
  Filled 2019-07-29 (×2): qty 1

## 2019-07-29 MED ORDER — TERBUTALINE SULFATE 1 MG/ML IJ SOLN: 0.2500 mg | Freq: Once | INTRAMUSCULAR | Status: DC | PRN

## 2019-07-29 MED ORDER — LACTATED RINGERS IV SOLN
500.0000 mL | Freq: Once | INTRAVENOUS | Status: AC
  Administered 2019-07-29: 500 mL via INTRAVENOUS

## 2019-07-29 MED ORDER — GENTAMICIN SULFATE 40 MG/ML IJ SOLN
5.0000 mg/kg | INTRAVENOUS | Status: DC
  Administered 2019-07-30: 410 mg via INTRAVENOUS
  Filled 2019-07-29: qty 10.25

## 2019-07-29 MED ORDER — DIPHENHYDRAMINE HCL 50 MG/ML IJ SOLN: 12.5000 mg | INTRAMUSCULAR | Status: DC | PRN

## 2019-07-29 MED ORDER — LIDOCAINE HCL (PF) 1 % IJ SOLN: 30.0000 mL | INTRAMUSCULAR | Status: DC | PRN

## 2019-07-29 MED ORDER — OXYCODONE-ACETAMINOPHEN 5-325 MG PO TABS: 2.0000 | ORAL_TABLET | ORAL | Status: DC | PRN

## 2019-07-29 MED ORDER — OXYTOCIN BOLUS FROM INFUSION
500.0000 mL | Freq: Once | INTRAVENOUS | Status: AC
  Administered 2019-07-30: 500 mL via INTRAVENOUS

## 2019-07-29 MED ORDER — OXYTOCIN 40 UNITS IN NORMAL SALINE INFUSION - SIMPLE MED
2.5000 [IU]/h | INTRAVENOUS | Status: DC
  Filled 2019-07-29: qty 1000

## 2019-07-29 MED ORDER — ACETAMINOPHEN 325 MG PO TABS
650.0000 mg | ORAL_TABLET | ORAL | Status: DC | PRN
  Administered 2019-07-29: 650 mg via ORAL
  Filled 2019-07-29: qty 2

## 2019-07-29 MED ORDER — ONDANSETRON HCL 4 MG/2ML IJ SOLN
4.0000 mg | Freq: Four times a day (QID) | INTRAMUSCULAR | Status: DC | PRN
  Administered 2019-07-30: 4 mg via INTRAVENOUS
  Filled 2019-07-29: qty 2

## 2019-07-29 MED ORDER — HYDROXYZINE HCL 50 MG PO TABS: 50.0000 mg | ORAL_TABLET | Freq: Four times a day (QID) | ORAL | Status: DC | PRN

## 2019-07-29 MED ORDER — PHENYLEPHRINE 40 MCG/ML (10ML) SYRINGE FOR IV PUSH (FOR BLOOD PRESSURE SUPPORT)
80.0000 ug | PREFILLED_SYRINGE | INTRAVENOUS | Status: DC | PRN
  Filled 2019-07-29: qty 10

## 2019-07-29 MED ORDER — OXYCODONE-ACETAMINOPHEN 5-325 MG PO TABS: 1.0000 | ORAL_TABLET | ORAL | Status: DC | PRN

## 2019-07-29 NOTE — Anesthesia Procedure Notes (Addendum)
Epidural Patient location during procedure: OB Start time: 07/29/2019 9:37 PM End time: 07/29/2019 9:45 PM  Staffing Anesthesiologist: Lucretia Kern, MD Performed: anesthesiologist   Preanesthetic Checklist Completed: patient identified, IV checked, risks and benefits discussed, monitors and equipment checked, pre-op evaluation and timeout performed  Epidural Patient position: sitting Prep: DuraPrep Patient monitoring: heart rate, continuous pulse ox and blood pressure Approach: midline Location: L3-L4 Injection technique: LOR air  Needle:  Needle type: Tuohy  Needle gauge: 17 G Needle length: 9 cm Needle insertion depth: 7 cm Catheter type: closed end flexible Catheter size: 19 Gauge Catheter at skin depth: 12 cm Test dose: negative  Assessment Events: blood not aspirated, injection not painful, no injection resistance, no paresthesia and negative IV test  Additional Notes Reason for block:procedure for pain

## 2019-07-29 NOTE — Progress Notes (Signed)
LABOR PROGRESS NOTE  Diana Schmitt is a 19 y.o. G1P0 at [redacted]w[redacted]d  admitted for IOL for IUGR (9.4 percentile at 35+1) and gestational hypertension (negative preeclampsia labs).  Subjective: Strip note. Discussed plan of care with RN.   Objective: BP 134/72   Pulse 88   Temp 99.6 F (37.6 C) (Oral)   Resp 18   Ht 5' (1.524 m)   Wt 82.2 kg   LMP 10/19/2018 (Exact Date)   BMI 35.39 kg/m  or  Vitals:   07/29/19 1559 07/29/19 1632 07/29/19 1709 07/29/19 1818  BP: (!) 148/97 133/66 134/86 134/72  Pulse: 97 89 (!) 112 88  Resp: 18 18 18 18   Temp:   100.2 F (37.9 C) 99.6 F (37.6 C)  TempSrc:    Oral  Weight:      Height:        Dilation: 4.5 Effacement (%): 70 Station: -2 Presentation: Vertex Exam by:: k fields, rn FHT: baseline rate 155, moderate varibility, +acel, no decel Toco: q1-3 min   Labs: Lab Results  Component Value Date   WBC 5.2 07/29/2019   HGB 10.9 (L) 07/29/2019   HCT 33.9 (L) 07/29/2019   MCV 84.5 07/29/2019   PLT 134 (L) 07/29/2019    Patient Active Problem List   Diagnosis Date Noted  . IUGR (intrauterine growth restriction) affecting care of mother 07/29/2019  . Poor fetal growth affecting pregnancy, antepartum 05/21/2019  . Obesity affecting pregnancy 05/21/2019  . Elevated BP without diagnosis of hypertension 05/21/2019  . Supervision of high-risk pregnancy of young primigravida 02/24/2019    Assessment / Plan: 19 y.o. G1P0 at [redacted]w[redacted]d here for IOL for IUGR and gHTN. BPs stable.   Labor: Cervix starting to soften, but otherwise exam remains unchanged. Not yet in active labor. Pitocin at 10 mu/min, continue to titrate as appropriate.  Fetal Wellbeing:  Cat I  Pain Control:  Undecided regarding Epidural  Anticipated MOD:  NSVD   [redacted]w[redacted]d, D.O. OB Fellow  07/29/2019, 6:40 PM

## 2019-07-29 NOTE — H&P (Addendum)
OBSTETRIC ADMISSION HISTORY AND PHYSICAL  Diana Schmitt is a 19 y.o. female G39P0 with IUP at [redacted]w[redacted]d by 8-week ultrasound presenting for induction of labor for IUGR (9.4 percentile at 35+1) and gestational hypertension (negative preeclampsia labs). She reports +FMs. No LOF, VB, blurry vision, headaches, peripheral edema, or RUQ pain. She plans on breast feeding. She is undecided for birth control.  Dating: By 8-week ultrasound --->  Estimated Date of Delivery: 08/18/19  Sono:   01/07/2019: 8+1, single IUP 03/19/2019: 17+2, EFW 203 g, 14th percentile.  Anatomy otherwise normal and complete.  Female genitalia. 04/16/2019: 21+1, EFW 435 g, 15th percentile, BPD 2.6 percentile, HC 2.9 percentile 05/21/2019: 25+6, 828 g, 3.1 percentile, breech, posterior placenta 06/04/2019: 28+2, 1192 g, 10th percentile, BPP 8/8 05/19/2027: 29+2, 1391 g, 7.3 percentile.  BPP 8/8.   07/15/2019: 32+6, 2168 g, 9 point 4th percentile.  Cephalic, posterior placenta.  BPP 8/8.  RI ratio 74% 07/23/2019: BPP 8/8, RI 60%  Prenatal History/Complications: IUGR Gestational hypertension  Past Medical History: Past Medical History:  Diagnosis Date  . Medical history non-contributory    Past Surgical History: Past Surgical History:  Procedure Laterality Date  . LIPOMA EXCISION N/A    Obstetrical History: OB History    Gravida  1   Para      Term      Preterm      AB      Living        SAB      TAB      Ectopic      Multiple      Live Births             Social History: Social History   Socioeconomic History  . Marital status: Single    Spouse name: Not on file  . Number of children: Not on file  . Years of education: Not on file  . Highest education level: 12th grade  Occupational History  . Occupation: unemployed  Tobacco Use  . Smoking status: Never Smoker  . Smokeless tobacco: Never Used  Substance and Sexual Activity  . Alcohol use: Not Currently  . Drug use: Never  . Sexual activity: Yes     Birth control/protection: None  Other Topics Concern  . Not on file  Social History Narrative  . Not on file   Social Determinants of Health   Financial Resource Strain: Low Risk   . Difficulty of Paying Living Expenses: Not hard at all  Food Insecurity: No Food Insecurity  . Worried About Charity fundraiser in the Last Year: Never true  . Ran Out of Food in the Last Year: Never true  Transportation Needs: No Transportation Needs  . Lack of Transportation (Medical): No  . Lack of Transportation (Non-Medical): No  Physical Activity:   . Days of Exercise per Week: Not on file  . Minutes of Exercise per Session: Not on file  Stress: No Stress Concern Present  . Feeling of Stress : Only a little  Social Connections:   . Frequency of Communication with Friends and Family: Not on file  . Frequency of Social Gatherings with Friends and Family: Not on file  . Attends Religious Services: Not on file  . Active Member of Clubs or Organizations: Not on file  . Attends Archivist Meetings: Not on file  . Marital Status: Not on file    Family History: No family history on file.  Allergies: No Known Allergies  Medications Prior to  Admission  Medication Sig Dispense Refill Last Dose  . Blood Pressure Monitoring (BLOOD PRESSURE CUFF) MISC 1 Device by Does not apply route once a week. 1 each 0   . ferrous sulfate 325 (65 FE) MG tablet Take 1 tablet (325 mg total) by mouth 2 (two) times daily with a meal. 60 tablet 3   . omeprazole (PRILOSEC) 20 MG capsule Take 1 capsule (20 mg total) by mouth daily. 30 capsule 6   . Prenatal Vit-Fe Fumarate-FA (PRENATAL VITAMIN PO) Take by mouth daily.       Review of Systems: All systems reviewed and negative except as stated in HPI  PE: Patient Vitals for the past 24 hrs:  BP Temp Temp src Pulse Resp Height Weight  07/29/19 0143 (!) 144/85 -- -- 83 18 -- --  07/29/19 0109 -- -- -- -- -- 5' (1.524 m) 82.2 kg  07/29/19 0035 131/83  98.9 F (37.2 C) Oral (!) 107 18 -- --   Last menstrual period 10/19/2018. General appearance: alert, cooperative, appears stated age and no distress Lungs: regular rate and effort Heart: regular rate  Abdomen: soft, non-tender Extremities: Homans sign is negative, no sign of DVT Presentation: cephalic EFM: 140 bpm, moderate variability, - accels, - decels Toco: irregular    Prenatal labs: ABO, Rh: O/Positive/-- (07/30 0959) Antibody: Negative (11/05 0925) Rubella: 2.23 (07/30 0959) RPR: Non Reactive (11/05 0925)  HBsAg: Negative (07/30 0959)  HIV: Non Reactive (11/05 0925)  GBS: Positive/-- (01/07 1400)  1 hr GTT 66  Prenatal Transfer Tool  Maternal Diabetes: No Genetic Screening: Normal Maternal Ultrasounds/Referrals: Normal Fetal Ultrasounds or other Referrals:  None Maternal Substance Abuse:  No Significant Maternal Medications:  None Significant Maternal Lab Results: Group B Strep positive  No results found for this or any previous visit (from the past 24 hour(s)).  Patient Active Problem List   Diagnosis Date Noted  . IUGR (intrauterine growth restriction) affecting care of mother 07/29/2019  . Poor fetal growth affecting pregnancy, antepartum 05/21/2019  . Obesity affecting pregnancy 05/21/2019  . Elevated BP without diagnosis of hypertension 05/21/2019  . Supervision of high-risk pregnancy of young primigravida 02/24/2019    Assessment: Diana Schmitt is a 19 y.o. G1P0 at [redacted]w[redacted]d here for IOL for gHTN and IUGR (9.4 percentile at 35+1).   1. Labor: CE: 1.5/thick/-3. Pt tolerated FB placement well. Appreciated blood flow back from FB tube. Accumulated only drops of blood by gravity. FHR stable. Will start cytotec buccal x 1 and recheck in 4 hours.  2. FWB: Category I 3. Pain: per patient request  4. GBS: positive: PCN   GHTN: Last BP 1434/85. Monitor BP q30 minutes. Labetolol protocol for severe range pressures. IUGR: expectant management.    Plan: Admit to LD    Melene Plan, MD  07/29/2019, 12:24 AM   OB FELLOW ATTESTATION  I have seen and examined this patient and agree with above documentation in the resident's note except as noted below.  19 y/o G1P0 at [redacted]w[redacted]d here for IOL for IUGR and gHTN. IUGR at 9% on 07/15/2019, most recent dopplers normal on 07/23/2019. TK:PTWS diagnosed early, CMP unremarkable, CBC notable for platelets 134 (last check on 05/25/2019 they were 180), UPCR still pending. Thrombocytopenia mild, ?gestational, will need another check if pressures rise or at time of epidural. Other prenatal labs unremarkable with exception of GBS+, ordered for penicillin. Having a girl, planning to breast and bottle feed, still undecided on contraception. Initial exam favorable for successful foley bulb  placement by Dr. Selena Batten, some blood return noted, but FHT remained Cat I with accels throughout and only scant amount of blood, suspect related to cervical manipulation and less likely interaction of foley with placenta. Plan to observe tracing for 20 min and given PO miso if remains Cat I.   Zack Seal, MD/[facility] OB Fellow  07/29/2019, 1:53 AM

## 2019-07-29 NOTE — Progress Notes (Signed)
LABOR PROGRESS NOTE  Graciemae Delisle is a 19 y.o. G1P0 at [redacted]w[redacted]d  admitted for IOL for IUGR (9.4 percentile at 35+1) and gestational hypertension (negative preeclampsia labs).  Subjective: Patient reports feeling better now that FB is out. Does not need anything or have any questions right now. FOB at bedside and on facetime with other support people.   Objective: BP 125/64   Pulse 89   Temp 98.9 F (37.2 C) (Oral)   Resp 16   Ht 5' (1.524 m)   Wt 82.2 kg   LMP 10/19/2018 (Exact Date)   BMI 35.39 kg/m  or  Vitals:   07/29/19 0531 07/29/19 0640 07/29/19 0726 07/29/19 0834  BP: 122/66 130/73  125/64  Pulse: 85 83  89  Resp: 18 18  16   Temp:   98.9 F (37.2 C)   TempSrc:   Oral   Weight:      Height:        Dilation: (defer check per provider due to foley bulb) Effacement (%): Thick Station: -3 Presentation: Vertex Exam by:: Dr. [email or web address]  FHT: baseline rate 135, moderate varibility, +acel, no decel Toco: q1-2 min   Labs: Lab Results  Component Value Date   WBC 5.2 07/29/2019   HGB 10.9 (L) 07/29/2019   HCT 33.9 (L) 07/29/2019   MCV 84.5 07/29/2019   PLT 134 (L) 07/29/2019    Patient Active Problem List   Diagnosis Date Noted  . IUGR (intrauterine growth restriction) affecting care of mother 07/29/2019  . Poor fetal growth affecting pregnancy, antepartum 05/21/2019  . Obesity affecting pregnancy 05/21/2019  . Elevated BP without diagnosis of hypertension 05/21/2019  . Supervision of high-risk pregnancy of young primigravida 02/24/2019    Assessment / Plan: 19 y.o. G1P0 at [redacted]w[redacted]d here for IOL for IUGR and gHTN. BPs stable.   Labor: S/p FB. Still within 4 hour time period from last cytotec dose. Will plan to recheck when appropriate and likely start Pitocin.  Fetal Wellbeing:  Cat I  Pain Control:  Undecided regarding Epidural  Anticipated MOD:  NSVD   [redacted]w[redacted]d, D.O. OB Fellow  07/29/2019, 9:53 AM

## 2019-07-29 NOTE — Progress Notes (Signed)
Patient ID: Diana Schmitt, female   DOB: 2001-06-02, 19 y.o.   MRN: [id number]  Feeling the ctx somewhat more, but still able to talk thru them; s/p cervical foley and cytotec x 2  BP 145/79, P 79 FHR 140s, +accels, no decels, some variables Ctx q 2-4 mins with Pit at 40mu/min Cx 4-5/70/vtx -2  IUP@37 .1wks IOL process gHTN FGR  Plan to increase Pit to achieve active labor; may do a Pit break prn if we get to 78mu/min without much cx change or increase in pain  Arabella Merles CNM 07/29/2019 8:04 PM

## 2019-07-29 NOTE — Anesthesia Preprocedure Evaluation (Signed)

## 2019-07-29 NOTE — Progress Notes (Addendum)
Diana Schmitt is a 19 y.o. female G1P0 with IUP at [redacted]w[redacted]d by 8-week ultrasound presenting for induction of labor for IUGR (9.4 percentile at 35+1) and gestational hypertension (negative preeclampsia labs).  Subjective: Resting comfortably. In and out of sleep. Reports no pain.   Objective: BP 122/66   Pulse 85   Temp 98.9 F (37.2 C) (Oral)   Resp 18   Ht 5' (1.524 m)   Wt 82.2 kg   LMP 10/19/2018 (Exact Date)   BMI 35.39 kg/m  Patient Vitals for the past 24 hrs:  BP Temp Temp src Pulse Resp Height Weight  07/29/19 0531 122/66 -- -- 85 18 -- --  07/29/19 0400 140/90 -- -- 90 18 -- --  07/29/19 0254 (!) 144/94 -- -- 95 18 -- --  07/29/19 0143 (!) 144/85 -- -- 83 18 -- --  07/29/19 0109 -- -- -- -- -- 5' (1.524 m) 82.2 kg  07/29/19 0035 131/83 98.9 F (37.2 C) Oral (!) 107 18 -- --   FHT:  FHR: 135 bpm, variability: moderate,  accelerations:  Present,  decelerations:  Absent UC:   irregular, every 1.5-5 minutes SVE:   Dilation: (defer check per provider due to foley bulb) Effacement (%): Thick Station: -3 Exam by:: Dr. Selena Batten   Labs: Lab Results  Component Value Date   WBC 5.2 07/29/2019   HGB 10.9 (L) 07/29/2019   HCT 33.9 (L) 07/29/2019   MCV 84.5 07/29/2019   PLT 134 (L) 07/29/2019   Assessment / Plan: IOL for gHTN and IUGR.   Labor: FB placed at 0130 and cytotec@ 0200. Will dose another buccal cytotec shortly.  gHTN:  Labs stable. Bps stable. No severe ranges  Fetal Wellbeing:  Category I Pain Control:  IV pain meds I/D:  POS, PCN Anticipated MOD:  VD  Melene Plan 07/29/2019, 5:55 AM

## 2019-07-29 NOTE — Progress Notes (Addendum)
Dulse Joyceis a 19 y.o.femaleG1P0 with IUP at [redacted]w[redacted]d by8-week ultrasoundpresenting forinduction of labor forIUGR(9.4 percentile at 35+1) and gestational hypertension (negative preeclampsia labs).  Subjective: Denies headaches, flashers, floaters, persistent RUQ pain, n/v. Otherwise feels well.   Objective: BP 138/80   Pulse 91   Temp 99.4 F (37.4 C)   Resp 18   Ht 5' (1.524 m)   Wt 82.2 kg   LMP 10/19/2018 (Exact Date)   BMI 35.39 kg/m  Patient Vitals for the past 24 hrs:  BP Temp Temp src Pulse Resp Height Weight  07/29/19 2230 138/80 -- -- 91 18 -- --  07/29/19 2220 131/83 -- -- (!) 107 18 -- --  07/29/19 2215 136/85 -- -- (!) 113 18 -- --  07/29/19 2210 (!) 144/79 -- -- (!) 104 18 -- --  07/29/19 2205 136/76 -- -- (!) 107 18 -- --  07/29/19 2200 133/76 99.4 F (37.4 C) -- (!) 107 18 -- --  07/29/19 2155 134/83 -- -- (!) 115 18 -- --  07/29/19 2153 131/86 -- -- (!) 105 18 -- --  07/29/19 2150 134/86 -- -- (!) 112 18 -- --  07/29/19 2144 133/77 -- -- (!) 111 18 -- --  07/29/19 2130 140/82 -- -- 90 18 -- --   FHT:  FHR: 140 bpm, variability: moderate,  accelerations:  Present,  decelerations:  Absent UC:   regular, every 1.5 minutes SVE:   Dilation: 4.5 Effacement (%): 70, 80 Station: -2 Exam by:: B McClam, RN   Labs: Lab Results  Component Value Date   WBC 8.8 07/29/2019   HGB 10.7 (L) 07/29/2019   HCT 33.2 (L) 07/29/2019   MCV 83.8 07/29/2019   PLT 138 (L) 07/29/2019    Assessment / Plan: IOL for gHTN and IUGR.   Labor: s/p FB, ctytoec x 2. Pit (1100), currently 46mu/min.  Triple I: two temps above 37.0. T max 37.9 and tachycardia wince about 1700. Will start amp and gent for suspected Tripe I. Patient understands and is agreeable to start antibiotics, amp and gent.  HTN: BP's as above. No mag at this time. Fetal Wellbeing:  Category I Pain Control:  Epidural I/D:  Positive, PCN Anticipated MOD:  VD  Melene Plan 07/29/2019, 10:49 PM

## 2019-07-30 ENCOUNTER — Encounter: Payer: Medicaid Other | Admitting: Obstetrics and Gynecology

## 2019-07-30 ENCOUNTER — Other Ambulatory Visit: Payer: Medicaid Other

## 2019-07-30 DIAGNOSIS — O365990 Maternal care for other known or suspected poor fetal growth, unspecified trimester, not applicable or unspecified: Secondary | ICD-10-CM

## 2019-07-30 DIAGNOSIS — O139 Gestational [pregnancy-induced] hypertension without significant proteinuria, unspecified trimester: Secondary | ICD-10-CM

## 2019-07-30 DIAGNOSIS — O411230 Chorioamnionitis, third trimester, not applicable or unspecified: Secondary | ICD-10-CM

## 2019-07-30 DIAGNOSIS — O99824 Streptococcus B carrier state complicating childbirth: Secondary | ICD-10-CM

## 2019-07-30 DIAGNOSIS — Z3A37 37 weeks gestation of pregnancy: Secondary | ICD-10-CM

## 2019-07-30 DIAGNOSIS — O134 Gestational [pregnancy-induced] hypertension without significant proteinuria, complicating childbirth: Secondary | ICD-10-CM

## 2019-07-30 MED ORDER — MEASLES, MUMPS & RUBELLA VAC IJ SOLR: 0.5000 mL | Freq: Once | INTRAMUSCULAR | Status: DC

## 2019-07-30 MED ORDER — BENZOCAINE-MENTHOL 20-0.5 % EX AERO: 1.0000 "application " | INHALATION_SPRAY | CUTANEOUS | Status: DC | PRN

## 2019-07-30 MED ORDER — ACETAMINOPHEN 325 MG PO TABS: 650.0000 mg | ORAL_TABLET | ORAL | Status: DC | PRN

## 2019-07-30 MED ORDER — FAMOTIDINE 20 MG PO TABS: 20.0000 mg | ORAL_TABLET | Freq: Two times a day (BID) | ORAL | Status: DC | PRN

## 2019-07-30 MED ORDER — OXYCODONE-ACETAMINOPHEN 5-325 MG PO TABS: 2.0000 | ORAL_TABLET | ORAL | Status: DC | PRN

## 2019-07-30 MED ORDER — ZOLPIDEM TARTRATE 5 MG PO TABS: 5.0000 mg | ORAL_TABLET | Freq: Every evening | ORAL | Status: DC | PRN

## 2019-07-30 MED ORDER — WITCH HAZEL-GLYCERIN EX PADS: 1.0000 "application " | MEDICATED_PAD | CUTANEOUS | Status: DC | PRN

## 2019-07-30 MED ORDER — ONDANSETRON HCL 4 MG/2ML IJ SOLN: 4.0000 mg | INTRAMUSCULAR | Status: DC | PRN

## 2019-07-30 MED ORDER — PRENATAL MULTIVITAMIN CH
1.0000 | ORAL_TABLET | Freq: Every day | ORAL | Status: DC
  Administered 2019-07-31: 1 via ORAL
  Filled 2019-07-30: qty 1

## 2019-07-30 MED ORDER — DIPHENHYDRAMINE HCL 25 MG PO CAPS: 25.0000 mg | ORAL_CAPSULE | Freq: Four times a day (QID) | ORAL | Status: DC | PRN

## 2019-07-30 MED ORDER — SIMETHICONE 80 MG PO CHEW: 80.0000 mg | CHEWABLE_TABLET | ORAL | Status: DC | PRN

## 2019-07-30 MED ORDER — FAMOTIDINE IN NACL 20-0.9 MG/50ML-% IV SOLN
20.0000 mg | Freq: Two times a day (BID) | INTRAVENOUS | Status: DC
  Administered 2019-07-30: 20 mg via INTRAVENOUS
  Filled 2019-07-30 (×3): qty 50

## 2019-07-30 MED ORDER — IBUPROFEN 600 MG PO TABS
600.0000 mg | ORAL_TABLET | Freq: Four times a day (QID) | ORAL | Status: DC
  Administered 2019-07-30 – 2019-08-01 (×8): 600 mg via ORAL
  Filled 2019-07-30 (×8): qty 1

## 2019-07-30 MED ORDER — ONDANSETRON HCL 4 MG PO TABS: 4.0000 mg | ORAL_TABLET | ORAL | Status: DC | PRN

## 2019-07-30 MED ORDER — COCONUT OIL OIL: 1.0000 "application " | TOPICAL_OIL | Status: DC | PRN

## 2019-07-30 MED ORDER — DIBUCAINE (PERIANAL) 1 % EX OINT: 1.0000 "application " | TOPICAL_OINTMENT | CUTANEOUS | Status: DC | PRN

## 2019-07-30 MED ORDER — MAGNESIUM HYDROXIDE 400 MG/5ML PO SUSP: 30.0000 mL | ORAL | Status: DC | PRN

## 2019-07-30 MED ORDER — TETANUS-DIPHTH-ACELL PERTUSSIS 5-2.5-18.5 LF-MCG/0.5 IM SUSP: 0.5000 mL | Freq: Once | INTRAMUSCULAR | Status: DC

## 2019-07-30 MED ORDER — FERROUS SULFATE 325 (65 FE) MG PO TABS
325.0000 mg | ORAL_TABLET | Freq: Two times a day (BID) | ORAL | Status: DC
  Administered 2019-07-30 – 2019-08-01 (×4): 325 mg via ORAL
  Filled 2019-07-30 (×4): qty 1

## 2019-07-30 MED ORDER — OXYCODONE-ACETAMINOPHEN 5-325 MG PO TABS: 1.0000 | ORAL_TABLET | ORAL | Status: DC | PRN

## 2019-07-30 NOTE — Discharge Summary (Signed)
Postpartum Discharge Summary   Patient Name: Diana Schmitt DOB: 2001-06-21 MRN: [id number]  Date of admission: 07/29/2019 Delivering Provider: Zettie Cooley E   Date of discharge: 08/01/2019  Admitting diagnosis: IUGR (intrauterine growth restriction) affecting care of mother [O36.5990] Intrauterine pregnancy: [redacted]w[redacted]d    Secondary diagnosis:  Active Problems:   IUGR (intrauterine growth restriction) affecting care of mother   SVD (spontaneous vaginal delivery)  Additional problems: Gestational Hypertension     Discharge diagnosis: Term Pregnancy Delivered, Gestational Hypertension and Fetal growth restriction                                                                                                Post partum procedures:none  Augmentation: AROM, Pitocin, Cytotec and Foley Balloon  Complications: None  Hospital course:  Induction of Labor With Vaginal Delivery   19y.o. yo G1P0 at 353w2das admitted to the hospital 07/29/2019 for induction of labor.  Indication for induction: Gestational hypertension and FGR.  Patient had an uncomplicated labor course as follows: Membrane Rupture Time/Date: 2:00 AM ,07/30/2019   Intrapartum Procedures: Episiotomy: None [1]                                         Lacerations:  None [1]  Patient had delivery of a Viable infant.  Information for the patient's newborn:  JoGaladriel, Shroff0[[id number]]Delivery Method: Vaginal, Spontaneous(Filed from Delivery Summary)    07/30/2019  Details of delivery can be found in separate delivery note.  Patient had a routine postpartum course. Patient is discharged home 08/01/19. Delivery time: 7:57 AM    Magnesium Sulfate received: No BMZ received: No Rhophylac:N/A MMR:No Transfusion:No  Physical exam  Vitals:   07/30/19 2318 07/31/19 1535 08/01/19 0111 08/01/19 0659  BP: 131/78 132/87 (!) 146/97 132/84  Pulse: 60 72 63 73  Resp:  18 18   Temp: 98.3 F (36.8 C) 98.7 F (37.1 C) 98.3 F (36.8 C)  98.2 F (36.8 C)  TempSrc:  Oral  Oral  SpO2:      Weight:      Height:       General: alert, cooperative and no distress Lochia: appropriate Uterine Fundus: firm Incision: N/A DVT Evaluation: No evidence of DVT seen on physical exam. No cords or calf tenderness. No significant calf/ankle edema. Labs: Lab Results  Component Value Date   WBC 8.8 07/29/2019   HGB 10.7 (L) 07/29/2019   HCT 33.2 (L) 07/29/2019   MCV 83.8 07/29/2019   PLT 138 (L) 07/29/2019   CMP Latest Ref Rng & Units 07/29/2019  Glucose 70 - 99 mg/dL 75  BUN 6 - 20 mg/dL 5(L)  Creatinine 0.44 - 1.00 mg/dL 0.62  Sodium 135 - 145 mmol/L 137  Potassium 3.5 - 5.1 mmol/L 4.0  Chloride 98 - 111 mmol/L 108  CO2 22 - 32 mmol/L 19(L)  Calcium 8.9 - 10.3 mg/dL 9.2  Total Protein 6.5 - 8.1 g/dL 6.4(L)  Total Bilirubin 0.3 - 1.2 mg/dL 0.7  Alkaline Phos 38 - 126 U/L 136(H)  AST 15 - 41 U/L 18  ALT 0 - 44 U/L 9    Discharge instruction: per After Visit Summary and "Baby and Me Booklet".  After visit meds:  Allergies as of 08/01/2019   No Known Allergies     Medication List    TAKE these medications   Blood Pressure Cuff Misc 1 Device by Does not apply route once a week.   ferrous sulfate 325 (65 FE) MG tablet Take 1 tablet (325 mg total) by mouth 2 (two) times daily with a meal.   ibuprofen 600 MG tablet Commonly known as: ADVIL Take 1 tablet (600 mg total) by mouth every 6 (six) hours as needed.   omeprazole 20 MG capsule Commonly known as: PriLOSEC Take 1 capsule (20 mg total) by mouth daily.   prenatal multivitamin Tabs tablet Take 1 tablet by mouth daily at 12 noon.       Diet: routine diet  Activity: Advance as tolerated. Pelvic rest for 6 weeks.   Outpatient follow up:1 week for BP check and 4 weeks for postpartum appointment  Follow up Appt: Future Appointments  Date Time Provider [place]  08/06/2019  9:50 AM [facility] NURSE [facility]  09/03/2019 11:50 AM  Cresenzo-Dishmon, Joaquim Lai, CNM [facility]   Follow up Visit: Follow-up Information    [facility]. Schedule an appointment as soon as possible for a visit in 4 week(s).   Specialty: Obstetrics and Gynecology Why: Make appointment to be seen in 4 weeks for postpartum care Contact information: [place] [phone]           Newborn Data: Live born female  Birth Weight:  2339g (5lb 2.5oz) APGAR: 101, 9  Newborn Delivery   Birth date/time: 07/30/2019 07:57:00 Delivery type: Vaginal, Spontaneous      Baby Feeding: Breast Disposition:home with mother   08/01/2019 Lajean Manes, CNM

## 2019-07-30 NOTE — Progress Notes (Signed)
Diana Joyceis a 19 y.o.femaleG1P0 with IUP at [redacted]w[redacted]d by8-week ultrasoundpresenting forinduction of labor forIUGR(9.4 percentile at 35+1) and gestational hypertension (negative preeclampsia labs).  Subjective: Patient feeling nauseous and having vomiting.   Objective: BP 106/79   Pulse (!) 134   Temp 99.4 F (37.4 C)   Resp 18   Ht 5' (1.524 m)   Wt 82.2 kg   LMP 10/19/2018 (Exact Date)   SpO2 98%   BMI 35.39 kg/m  Patient Vitals for the past 24 hrs:  BP Temp Temp src Pulse Resp SpO2  07/30/19 0200 106/79 - - (!) 134 18 98 %  07/30/19 0130 128/74 - - (!) 121 18 99 %  07/30/19 0100 122/63 - - (!) 101 18 -  07/30/19 0030 130/70 - - (!) 102 18 -  07/30/19 0000 118/65 - - 97 18 -  07/29/19 2330 139/78 - - 88 18 -  07/29/19 2300 140/79 - - (!) 109 18 -  07/29/19 2230 138/80 - - 91 18 -  07/29/19 2220 131/83 - - (!) 107 18 -  07/29/19 2215 136/85 - - (!) 113 18 -  07/29/19 2210 (!) 144/79 - - (!) 104 18 -  07/29/19 2205 136/76 - - (!) 107 18 -  07/29/19 2200 133/76 99.4 F (37.4 C) - (!) 107 18 -  07/29/19 2155 134/83 - - (!) 115 18 -  07/29/19 2153 131/86 - - (!) 105 18 -  07/29/19 2150 134/86 - - (!) 112 18 -  07/29/19 2144 133/77 - - (!) 111 18 -  07/29/19 2130 140/82 - - 90 18 -  07/29/19 2100 (!) 141/80 - - 81 18 -  07/29/19 2030 132/66 - - 85 18 -   FHT:  FHR: 140 bpm, variability: moderate,  accelerations:  Present,  decelerations:  Absent UC:   regular, every 1.5 minutes SVE:   Dilation: 4.5 Effacement (%): 80 Station: -2 Exam by:: Dr. Selena Batten   Labs: Lab Results  Component Value Date   WBC 8.8 07/29/2019   HGB 10.7 (L) 07/29/2019   HCT 33.2 (L) 07/29/2019   MCV 83.8 07/29/2019   PLT 138 (L) 07/29/2019    Assessment / Plan: IOL for gHTN and IUGR.   Labor: s/p FB, ctytoec x 2. Pit (1100).  Was called around 0100 for repeat late decels. Pit was stopped and terb x 1. Strip improved.  Just saw patient, AROM'ed and placed IUPC given slow cervical change.  Will monitor IUPC and restart pitocin to titrate contractions. 120 MVUs at this time.  Triple I:Continues to be tachycardic. No fevers at this time. HTN: BP's as above. Stable. No mag at this time. Fetal Wellbeing:  Category I Pain Control:  Epidural I/D:  Positive, PCN Anticipated MOD:  VD  Melene Plan 07/30/2019, 2:20 AM

## 2019-07-30 NOTE — Progress Notes (Signed)
Strip improved.  EFM baseline 140/+accel/-decel.  Toco: 120 MVU  Will restart pit at 78mu/min and titrate.   Melene Plan, M.D.  3:44 AM 07/30/2019

## 2019-07-30 NOTE — Anesthesia Postprocedure Evaluation (Signed)
Anesthesia Post Note  Patient: Diana Schmitt  Procedure(s) Performed: AN AD HOC LABOR EPIDURAL     Patient location during evaluation: Mother Baby Anesthesia Type: Epidural Level of consciousness: awake and alert Pain management: pain level controlled Vital Signs Assessment: post-procedure vital signs reviewed and stable Respiratory status: spontaneous breathing, nonlabored ventilation and respiratory function stable Cardiovascular status: stable Postop Assessment: no headache, no backache, epidural receding, no apparent nausea or vomiting, patient able to bend at knees, adequate PO intake and able to ambulate Anesthetic complications: no    Last Vitals:  Vitals:   07/30/19 1052 07/30/19 1529  BP: (!) 149/82 (!) 123/58  Pulse: 82 75  Resp: 20 18  Temp: 37.5 C 37.1 C  SpO2:      Last Pain:  Vitals:   07/30/19 1529  TempSrc: Oral  PainSc:    Pain Goal: Patients Stated Pain Goal: 10 (07/29/19 0733)                 Laban Emperor

## 2019-07-30 NOTE — Lactation Note (Signed)
This note was copied from a baby's chart. Lactation Consultation Note  Patient Name: Diana Schmitt WVPXT'G Date: 07/30/2019 Reason for consult: Initial assessment;Early term 37-38.6wks;Infant < 6lbs;Other (Comment)(IUGR) [name] 9 hours old.  IUGR and early term.  Infant showing early cues on arrival.  Mits on,  but hands to mouth.  Asked mom if I could assist with breastfeeding,  Mom agreed.  Assist with breastfeeding.  Mom with short/flat nipples that barely evert at this time.  Infant opened once but did not latch.  Left STS with mom and showed her how to hand expression. Colostrum easily expressed.  Stayed with parents and fed infant via spoon 5 ml of colostrum via spoon.  Initiiated pumping with DEBP with mom.  Gave manual pump to try and prepump to evert nipples and gave mom shells to wear.  Mom has hospital gown on at this time  but reports she did bring a bra with her.  Discussed how to post pump past bf and/or attempted feeds.  Reviewed and left handouts,  Urged to call lactation as needed  Maternal Data Has patient been taught Hand Expression?: Yes Does the patient have breastfeeding experience prior to this delivery?: No  Feeding Feeding Type: Formula Nipple Type: Extra Slow Flow  LATCH Score Latch: Too sleepy or reluctant, no latch achieved, no sucking elicited.  Audible Swallowing: None  Type of Nipple: Flat  Comfort (Breast/Nipple): Soft / non-tender  Hold (Positioning): Assistance needed to correctly position infant at breast and maintain latch.  LATCH Score: 4  Interventions Interventions: Breast feeding basics reviewed;Breast massage;Hand express;Pre-pump if needed;Shells;Hand pump;DEBP  Lactation Tools Discussed/Used WIC Program: Yes Pump Review: Setup, frequency, and cleaning;Milk Storage Initiated by:: Auden Tatar Date initiated:: 07/30/19   Consult Status Consult Status: Follow-up Date: 07/31/19 Follow-up type: In-patient    [facility] Michaelle Copas 07/30/2019, 7:55 PM

## 2019-07-31 ENCOUNTER — Encounter (HOSPITAL_COMMUNITY): Payer: Self-pay | Admitting: Family Medicine

## 2019-07-31 NOTE — Progress Notes (Addendum)
Post Partum Day 1 Subjective: no complaints, up ad lib, voiding, tolerating PO and + flatus  Objective: Blood pressure 131/78, pulse 60, temperature 98.3 F (36.8 C), resp. rate 17, height 5' (1.524 m), weight 82.2 kg, last menstrual period 10/19/2018, SpO2 99 %.  Physical Exam:  General: alert, cooperative and no distress  Cardio: Normal S1, S2, no m/r/g Lochia: appropriate Uterine Fundus: firm DVT Evaluation: No evidence of DVT seen on physical exam. No significant calf/ankle edema.  Recent Labs    07/29/19 0044 07/29/19 2053  HGB 10.9* 10.7*  HCT 33.9* 33.2*    Assessment/Plan: Diana Schmitt is a 19 yo G1P1 PPD#1 following VD at 35w3dw/ IOL for gHTN and IUGR. Pregnancy complicated by gHTN and GBS+ with possible triple I during labor.  ID: amp x2 and gent x1 for possible triple I (maternal tachycardia for >10 min, no maternal fevers) - patient has continued to be afebrile GHTN: blood pressures stable  Continue lactation consultation F/u with patient regarding contraception  Plan for discharge tomorrow   LOS: 2 days   LPhill Mutter1/15/2021, 7:44 AM    I personally saw and evaluated the patient, performing the key elements of the service. I developed and verified the management plan that is described in the resident's/student's note, and I agree with the content with my edits above. VSS, HRR&R, Resp unlabored, Legs neg.  FNigel Berthold CNM 07/31/2019 8:09 AM

## 2019-08-01 MED ORDER — IBUPROFEN 600 MG PO TABS: 600.0000 mg | ORAL_TABLET | Freq: Four times a day (QID) | ORAL | 0 refills | Status: DC | PRN

## 2019-08-03 ENCOUNTER — Other Ambulatory Visit: Payer: Medicaid Other

## 2019-08-06 ENCOUNTER — Telehealth: Payer: Medicaid Other

## 2019-08-06 ENCOUNTER — Other Ambulatory Visit: Payer: Medicaid Other

## 2019-08-06 ENCOUNTER — Encounter: Payer: Medicaid Other | Admitting: Advanced Practice Midwife

## 2019-09-03 ENCOUNTER — Telehealth: Payer: Medicaid Other | Admitting: Advanced Practice Midwife

## 2020-02-11 DIAGNOSIS — H5213 Myopia, bilateral: Secondary | ICD-10-CM | POA: Diagnosis not present

## 2021-09-18 DIAGNOSIS — J40 Bronchitis, not specified as acute or chronic: Secondary | ICD-10-CM | POA: Diagnosis not present

## 2021-09-18 DIAGNOSIS — J029 Acute pharyngitis, unspecified: Secondary | ICD-10-CM | POA: Diagnosis not present

## 2022-07-16 NOTE — L&D Delivery Note (Signed)
OB/GYN Faculty Practice Delivery Note  Diana Schmitt is a 22 y.o. [id number] s/p SVD at [redacted]w[redacted]d. She was admitted for IOL 2/2 cHTN.   ROM: 7h 49m with clear fluid GBS Status: Negative/-- (12/02 0206) Maximum Maternal Temperature: 98.19F   Labor Progress: Initial SVE: 3/30/-3. She then progressed to complete.   Delivery Date/Time: 06/27/23 1330 Delivery: Called to room and patient was complete and pushing. Head delivered LOA. Delivered through nuchal x 1 and noted to have nuchal arm. Shoulder and body delivered in usual fashion. Infant with spontaneous cry, placed on mother's abdomen, dried and stimulated. Cord clamped x 2 after 1-minute delay, and cut by father of baby, CJ. Cord blood drawn. Placenta delivered spontaneously with gentle cord traction. Fundus firm with massage and Pitocin. Labia, perineum, vagina, and cervix inspected inspected with a superficial vaginal abrasion which was bleeding, so was repaired in the usual fashion.  Baby Weight: pending  Placenta: Sent to L&D Complications: None Lacerations: Superficial vaginal laceration, repaired EBL: 404 mL Analgesia: Epidural   Infant:  APGAR (1 MIN): 9  APGAR (5 MINS): 9  Sundra Aland, MD OB Fellow, Faculty Practice [facility], Center for [facility]

## 2022-11-13 ENCOUNTER — Ambulatory Visit (INDEPENDENT_AMBULATORY_CARE_PROVIDER_SITE_OTHER): Payer: Medicaid Other | Admitting: Obstetrics and Gynecology

## 2022-11-13 ENCOUNTER — Encounter: Payer: Self-pay | Admitting: Obstetrics and Gynecology

## 2022-11-13 VITALS — BP 156/76 | HR 99 | Ht 60.0 in | Wt 213.8 lb

## 2022-11-13 DIAGNOSIS — Z3201 Encounter for pregnancy test, result positive: Secondary | ICD-10-CM | POA: Diagnosis not present

## 2022-11-13 DIAGNOSIS — Z789 Other specified health status: Secondary | ICD-10-CM | POA: Diagnosis not present

## 2022-11-13 DIAGNOSIS — O10919 Unspecified pre-existing hypertension complicating pregnancy, unspecified trimester: Secondary | ICD-10-CM | POA: Insufficient documentation

## 2022-11-13 LAB — POCT URINE PREGNANCY: Preg Test, Ur: POSITIVE — AB

## 2022-11-13 MED ORDER — LABETALOL HCL 200 MG PO TABS: 200.0000 mg | ORAL_TABLET | Freq: Two times a day (BID) | ORAL | 3 refills | Status: DC

## 2022-11-13 MED ORDER — PREPLUS 27-1 MG PO TABS: 1.0000 | ORAL_TABLET | Freq: Every day | ORAL | 13 refills | Status: DC

## 2022-11-13 NOTE — Progress Notes (Signed)
Diana Schmitt presents with + pregnancy test, confirmed in office today LMP last month, uncertain of exact date. Reports normal monthly cycles , however TSVD x 1, complicated by [facility] Denies chronic medical problems or medications otherwise  PE AF BP as recorded Lungs clear Heart RRR Abd soft + BS  A/P + pregnancy test        Chronic hypertension  Pt congratulated. Will start PNV and Labetalol. Schedule dating U/S.

## 2022-11-23 ENCOUNTER — Other Ambulatory Visit: Payer: Self-pay | Admitting: Obstetrics & Gynecology

## 2022-11-23 DIAGNOSIS — O3680X0 Pregnancy with inconclusive fetal viability, not applicable or unspecified: Secondary | ICD-10-CM

## 2022-11-26 ENCOUNTER — Ambulatory Visit (INDEPENDENT_AMBULATORY_CARE_PROVIDER_SITE_OTHER): Payer: Medicaid Other

## 2022-11-26 DIAGNOSIS — Z3481 Encounter for supervision of other normal pregnancy, first trimester: Secondary | ICD-10-CM

## 2022-11-26 DIAGNOSIS — O3680X0 Pregnancy with inconclusive fetal viability, not applicable or unspecified: Secondary | ICD-10-CM

## 2022-11-26 DIAGNOSIS — Z3A08 8 weeks gestation of pregnancy: Secondary | ICD-10-CM

## 2022-11-26 NOTE — Progress Notes (Signed)
[place] 7+1 wks,single IUP with yolk sac,normal ovaries,CRL 10.21 mm,FHR 138 bpm

## 2022-12-26 ENCOUNTER — Telehealth: Payer: Self-pay

## 2022-12-26 MED ORDER — ONDANSETRON HCL 4 MG PO TABS: 4.0000 mg | ORAL_TABLET | Freq: Three times a day (TID) | ORAL | 2 refills | Status: DC | PRN

## 2022-12-26 NOTE — Telephone Encounter (Signed)
Patient requesting nausea medication. States she is not vomiting, just nauseous all day. Advised message would be sent to provider and to check back with her pharmacy later this afternoon.

## 2022-12-26 NOTE — Telephone Encounter (Signed)
Patient would like nausea medication prescribed. She uses [facility] in [place]. Please advise.

## 2022-12-26 NOTE — Addendum Note (Signed)
Addended by: Cyril Mourning A on: 12/26/2022 03:47 PM   Modules accepted: Orders

## 2022-12-26 NOTE — Telephone Encounter (Signed)
Rx sent in for zofran.   

## 2023-01-02 ENCOUNTER — Encounter: Payer: Self-pay | Admitting: Women's Health

## 2023-01-02 DIAGNOSIS — O0990 Supervision of high risk pregnancy, unspecified, unspecified trimester: Secondary | ICD-10-CM | POA: Insufficient documentation

## 2023-01-02 DIAGNOSIS — Z87898 Personal history of other specified conditions: Secondary | ICD-10-CM | POA: Insufficient documentation

## 2023-01-04 ENCOUNTER — Other Ambulatory Visit: Payer: Self-pay | Admitting: Obstetrics & Gynecology

## 2023-01-04 DIAGNOSIS — Z3682 Encounter for antenatal screening for nuchal translucency: Secondary | ICD-10-CM

## 2023-01-07 ENCOUNTER — Ambulatory Visit (INDEPENDENT_AMBULATORY_CARE_PROVIDER_SITE_OTHER): Payer: Medicaid Other | Admitting: Women's Health

## 2023-01-07 ENCOUNTER — Encounter: Payer: Self-pay | Admitting: Women's Health

## 2023-01-07 ENCOUNTER — Ambulatory Visit (INDEPENDENT_AMBULATORY_CARE_PROVIDER_SITE_OTHER): Payer: Medicaid Other

## 2023-01-07 ENCOUNTER — Encounter: Payer: Medicaid Other | Admitting: *Deleted

## 2023-01-07 ENCOUNTER — Other Ambulatory Visit (HOSPITAL_COMMUNITY)
Admission: RE | Admit: 2023-01-07 | Discharge: 2023-01-07 | Disposition: A | Discharge status: Home / Self Care | Payer: Medicaid Other | Source: Ambulatory Visit | Attending: Women's Health | Admitting: Women's Health

## 2023-01-07 VITALS — BP 143/90 | HR 102 | Wt 211.2 lb

## 2023-01-07 DIAGNOSIS — Z3A13 13 weeks gestation of pregnancy: Secondary | ICD-10-CM

## 2023-01-07 DIAGNOSIS — O0991 Supervision of high risk pregnancy, unspecified, first trimester: Secondary | ICD-10-CM | POA: Insufficient documentation

## 2023-01-07 DIAGNOSIS — Z3682 Encounter for antenatal screening for nuchal translucency: Secondary | ICD-10-CM | POA: Diagnosis not present

## 2023-01-07 DIAGNOSIS — Z3481 Encounter for supervision of other normal pregnancy, first trimester: Secondary | ICD-10-CM | POA: Diagnosis not present

## 2023-01-07 DIAGNOSIS — Z131 Encounter for screening for diabetes mellitus: Secondary | ICD-10-CM

## 2023-01-07 DIAGNOSIS — Z6841 Body Mass Index (BMI) 40.0 and over, adult: Secondary | ICD-10-CM | POA: Diagnosis not present

## 2023-01-07 DIAGNOSIS — O10911 Unspecified pre-existing hypertension complicating pregnancy, first trimester: Secondary | ICD-10-CM

## 2023-01-07 DIAGNOSIS — Z87898 Personal history of other specified conditions: Secondary | ICD-10-CM

## 2023-01-07 DIAGNOSIS — O10919 Unspecified pre-existing hypertension complicating pregnancy, unspecified trimester: Secondary | ICD-10-CM | POA: Diagnosis not present

## 2023-01-07 DIAGNOSIS — O0992 Supervision of high risk pregnancy, unspecified, second trimester: Secondary | ICD-10-CM

## 2023-01-07 MED ORDER — ASPIRIN 81 MG PO TBEC: 162.0000 mg | DELAYED_RELEASE_TABLET | Freq: Every day | ORAL | 2 refills | Status: DC

## 2023-01-07 MED ORDER — BLOOD PRESSURE MONITOR MISC: 0 refills | Status: DC

## 2023-01-07 NOTE — Progress Notes (Signed)
[place] 13+1 wks,measurements c/w dates,CRL 75.35 mm,NB present,NT 1.9 mm,FHR 155 bpm,normal ovaries

## 2023-01-07 NOTE — Progress Notes (Signed)
INITIAL OBSTETRICAL VISIT Patient name: Diana Schmitt MRN [id number]  Date of birth: [birth year] Chief Complaint:   Initial Prenatal Visit  History of Present Illness:   Diana Schmitt is a 22 y.o. G20P1001 African-American female at [redacted]w[redacted]d by [place] at 7 weeks with an Estimated Date of Delivery: 07/14/23 being seen today for her initial obstetrical visit.   Patient's last menstrual period was 09/27/2022. Her obstetrical history is significant for  37wk SVB after IOL for FGR and GHTN . Did not f/u for pp visit. BP elevated at pregnancy confirmation visit 4/30, rx'd labetalol 200mg  BID, hasn't started yet, thought it was b/c she was anxious when bp was being taken.   Today she reports no complaints.  Last pap never. Results were: N/A     01/07/2023   10:24 AM 02/12/2019    9:09 AM 02/12/2019    9:07 AM  Depression screen PHQ 2/9  Decreased Interest 0 0 0  Down, Depressed, Hopeless 0 0 0  PHQ - 2 Score 0 0 0  Altered sleeping 0    Tired, decreased energy 0    Change in appetite 0    Feeling bad or failure about yourself  0    Trouble concentrating 0    Moving slowly or fidgety/restless 0    Suicidal thoughts 0    PHQ-9 Score 0          01/07/2023   10:24 AM  GAD 7 : Generalized Anxiety Score  Nervous, Anxious, on Edge 2  Control/stop worrying 2  Worry too much - different things 2  Trouble relaxing 1  Restless 0  Easily annoyed or irritable 0  Afraid - awful might happen 0  Total GAD 7 Score 7     Review of Systems:   Pertinent items are noted in HPI Denies cramping/contractions, leakage of fluid, vaginal bleeding, abnormal vaginal discharge w/ itching/odor/irritation, headaches, visual changes, shortness of breath, chest pain, abdominal pain, severe nausea/vomiting, or problems with urination or bowel movements unless otherwise stated above.  Pertinent History Reviewed:  Reviewed past medical,surgical, social, obstetrical and family history.  Reviewed problem list, medications  and allergies. OB History  Gravida Para Term Preterm AB Living  2 1 1     1   SAB IAB Ectopic Multiple Live Births        0 1    # Outcome Date GA Lbr Len/2nd Weight Sex Delivery Anes PTL Lv  2 Current           1 Term 07/30/19 [redacted]w[redacted]d 13:18 / 00:09 5 lb 2.5 oz (2.339 kg) F Vag-Spont EPI N LIV     Complications: Fetal growth restriction antepartum, Gestational hypertension   Physical Assessment:   Vitals:   01/07/23 1105  BP: (!) 143/90  Pulse: (!) 102  Weight: 211 lb 3.2 oz (95.8 kg)  Body mass index is 41.25 kg/m.       Physical Examination:  General appearance - well appearing, and in no distress  Mental status - alert, oriented to person, place, and time  Psych:  She has a normal mood and affect  Skin - warm and dry, normal color, no suspicious lesions noted  Chest - effort normal, all lung fields clear to auscultation bilaterally  Heart - normal rate and regular rhythm  Abdomen - soft, nontender  Extremities:  No swelling or varicosities noted  Pelvic - VULVA: normal appearing vulva with no masses, tenderness or lesions  VAGINA: normal appearing vagina with normal color and  discharge, no lesions  CERVIX: normal appearing cervix without discharge or lesions, no CMT  Thin prep pap is done w/ reflex HR HPV cotesting  Chaperone:  floating nurse     TODAY'S NT [place] 13+1 wks,measurements c/w dates,CRL 75.35 mm,NB present,NT 1.9 mm,FHR 155 bpm,normal ovaries   No results found for this or any previous visit (from the past 24 hour(s)).  Assessment & Plan:  1) High-Risk Pregnancy G2P1001 at [redacted]w[redacted]d with an Estimated Date of Delivery: 07/14/23   2) Initial OB visit  3) CHTN> h/o GHTN, rx'd Labetalol 200mg  BID at preg confirmation visit in April-hasn't started yet- to start today; start ASA, get baseline labs today  4) H/O FGR> plan serial u/s  5) PG BMI 41  Meds:  Meds ordered this encounter  Medications   Blood Pressure Monitor MISC    Sig: For regular home bp monitoring  during pregnancy    Dispense:  1 each    Refill:  0    Z34.81 Please mail to patient Needs large cuff   aspirin EC 81 MG tablet    Sig: Take 2 tablets (162 mg total) by mouth daily. Swallow whole.    Dispense:  180 tablet    Refill:  2    Initial labs obtained Continue prenatal vitamins Reviewed n/v relief measures and warning s/s to report Reviewed recommended weight gain based on pre-gravid BMI Encouraged well-balanced diet Genetic & carrier screening discussed: requests Panorama and NT/IT, neg Horizon prev preg Ultrasound discussed; fetal survey: requested CCNC completed> form faxed if has or is planning to apply for medicaid The nature of [place] [facility] [employer] with multiple MDs and other Advanced Practice Providers was explained to patient; also emphasized that fellows, residents, and students are part of our team. Does not have home bp cuff. Office bp cuff given: no. Rx sent: yes. Check bp weekly, let us know if consistently >140/90.   Indications for early A1C (per uptodate) BMI >=25 (>=23 in Asian women) AND one of the following High-risk race/ethnicity (eg, African American, Latino, Native American, [place] American, Pacific Islander) Yes HTN or on therapy for hypertension Yes  Follow-up: Return in about 3 weeks (around 01/28/2023) for HROB, 2nd IT, MD or CNM, in person; then 7wks anatomy u/s and HROB MD/CNM.   Orders Placed This Encounter  Procedures   Urine Culture   CBC/D/Plt+RPR+Rh+ABO+RubIgG...   PANORAMA PRENATAL TEST   Integrated 1   Protein / creatinine ratio, urine   Comprehensive metabolic panel   Hemoglobin A1c    Cheral Marker CNM, [facility] 01/07/2023 11:44 AM

## 2023-01-07 NOTE — Patient Instructions (Signed)
Diana Schmitt, thank you for choosing our office today! We appreciate the opportunity to meet your healthcare needs. You may receive a short survey by mail, e-mail, or through [employer]. If you are happy with your care we would appreciate if you could take just a few minutes to complete the survey questions. We read all of your comments and take your feedback very seriously. Thank you again for choosing our office.  Center for [employer] Healthcare Team at [facility]  [facility] at [facility] ([address] [place], [place] [address]) Entrance C, located off of [address] Free 24/7 valet parking   Nausea & Vomiting Have saltine crackers or pretzels by your bed and eat a few bites before you raise your head out of bed in the morning Eat small frequent meals throughout the day instead of large meals Drink plenty of fluids throughout the day to stay hydrated, just don't drink a lot of fluids with your meals.  This can make your stomach fill up faster making you feel sick Do not brush your teeth right after you eat Products with real ginger are good for nausea, like ginger ale and ginger hard candy Make sure it says made with real ginger! Sucking on sour candy like lemon heads is also good for nausea If your prenatal vitamins make you nauseated, take them at night so you will sleep through the nausea Sea Bands If you feel like you need medicine for the nausea & vomiting please let us know If you are unable to keep any fluids or food down please let us know   Constipation Drink plenty of fluid, preferably water, throughout the day Eat foods high in fiber such as fruits, vegetables, and grains Exercise, such as walking, is a good way to keep your bowels regular Drink warm fluids, especially warm prune juice, or decaf coffee Eat a 1/2 cup of real oatmeal (not instant), 1/2 cup applesauce, and 1/2-1 cup warm prune juice every day If needed, you may take Colace (docusate sodium) stool softener  once or twice a day to help keep the stool soft.  If you still are having problems with constipation, you may take Miralax once daily as needed to help keep your bowels regular.   Home Blood Pressure Monitoring for Patients   Your provider has recommended that you check your blood pressure (BP) at least once a week at home. If you do not have a blood pressure cuff at home, one will be provided for you. Contact your provider if you have not received your monitor within 1 week.   Helpful Tips for Accurate Home Blood Pressure Checks  Don't smoke, exercise, or drink caffeine 30 minutes before checking your BP Use the restroom before checking your BP (a full bladder can raise your pressure) Relax in a comfortable upright chair Feet on the ground Left arm resting comfortably on a flat surface at the level of your heart Legs uncrossed Back supported Sit quietly and don't talk Place the cuff on your bare arm Adjust snuggly, so that only two fingertips can fit between your skin and the top of the cuff Check 2 readings separated by at least one minute Keep a log of your BP readings For a visual, please reference this diagram: http://ccnc.care/bpdiagram  Provider Name: [facility]     Phone: [phone]  Zone 1: ALL CLEAR  Continue to monitor your symptoms:  BP reading is less than 140 (top number) or less than 90 (bottom  number)  No right upper stomach pain No headaches or seeing spots No feeling nauseated or throwing up No swelling in face and hands  Zone 2: CAUTION Call your doctor's office for any of the following:  BP reading is greater than 140 (top number) or greater than 90 (bottom number)  Stomach pain under your ribs in the middle or right side Headaches or seeing spots Feeling nauseated or throwing up Swelling in face and hands  Zone 3: EMERGENCY  Seek immediate medical care if you have any of the following:  BP reading is greater than160 (top number) or greater than  110 (bottom number) Severe headaches not improving with Tylenol Serious difficulty catching your breath Any worsening symptoms from Zone 2    First Trimester of Pregnancy The first trimester of pregnancy is from week 1 until the end of week 12 (months 1 through 3). A week after a sperm fertilizes an egg, the egg will implant on the wall of the uterus. This embryo will begin to develop into a baby. Genes from you and your partner are forming the baby. The female genes determine whether the baby is a boy or a girl. At 6-8 weeks, the eyes and face are formed, and the heartbeat can be seen on ultrasound. At the end of 12 weeks, all the baby's organs are formed.  Now that you are pregnant, you will want to do everything you can to have a healthy baby. Two of the most important things are to get good prenatal care and to follow your health care provider's instructions. Prenatal care is all the medical care you receive before the baby's birth. This care will help prevent, find, and treat any problems during the pregnancy and childbirth. BODY CHANGES Your body goes through many changes during pregnancy. The changes vary from woman to woman.  You may gain or lose a couple of pounds at first. You may feel sick to your stomach (nauseous) and throw up (vomit). If the vomiting is uncontrollable, call your health care provider. You may tire easily. You may develop headaches that can be relieved by medicines approved by your health care provider. You may urinate more often. Painful urination may mean you have a bladder infection. You may develop heartburn as a result of your pregnancy. You may develop constipation because certain hormones are causing the muscles that push waste through your intestines to slow down. You may develop hemorrhoids or swollen, bulging veins (varicose veins). Your breasts may begin to grow larger and become tender. Your nipples may stick out more, and the tissue that surrounds them  (areola) may become darker. Your gums may bleed and may be sensitive to brushing and flossing. Dark spots or blotches (chloasma, mask of pregnancy) may develop on your face. This will likely fade after the baby is born. Your menstrual periods will stop. You may have a loss of appetite. You may develop cravings for certain kinds of food. You may have changes in your emotions from day to day, such as being excited to be pregnant or being concerned that something may go wrong with the pregnancy and baby. You may have more vivid and strange dreams. You may have changes in your hair. These can include thickening of your hair, rapid growth, and changes in texture. Some women also have hair loss during or after pregnancy, or hair that feels dry or thin. Your hair will most likely return to normal after your baby is born. WHAT TO EXPECT AT YOUR PRENATAL  VISITS During a routine prenatal visit: You will be weighed to make sure you and the baby are growing normally. Your blood pressure will be taken. Your abdomen will be measured to track your baby's growth. The fetal heartbeat will be listened to starting around week 10 or 12 of your pregnancy. Test results from any previous visits will be discussed. Your health care provider may ask you: How you are feeling. If you are feeling the baby move. If you have had any abnormal symptoms, such as leaking fluid, bleeding, severe headaches, or abdominal cramping. If you have any questions. Other tests that may be performed during your first trimester include: Blood tests to find your blood type and to check for the presence of any previous infections. They will also be used to check for low iron levels (anemia) and Rh antibodies. Later in the pregnancy, blood tests for diabetes will be done along with other tests if problems develop. Urine tests to check for infections, diabetes, or protein in the urine. An ultrasound to confirm the proper growth and development  of the baby. An amniocentesis to check for possible genetic problems. Fetal screens for spina bifida and Down syndrome. You may need other tests to make sure you and the baby are doing well. HOME CARE INSTRUCTIONS  Medicines Follow your health care provider's instructions regarding medicine use. Specific medicines may be either safe or unsafe to take during pregnancy. Take your prenatal vitamins as directed. If you develop constipation, try taking a stool softener if your health care provider approves. Diet Eat regular, well-balanced meals. Choose a variety of foods, such as meat or vegetable-based protein, fish, milk and low-fat dairy products, vegetables, fruits, and whole grain breads and cereals. Your health care provider will help you determine the amount of weight gain that is right for you. Avoid raw meat and uncooked cheese. These carry germs that can cause birth defects in the baby. Eating four or five small meals rather than three large meals a day may help relieve nausea and vomiting. If you start to feel nauseous, eating a few soda crackers can be helpful. Drinking liquids between meals instead of during meals also seems to help nausea and vomiting. If you develop constipation, eat more high-fiber foods, such as fresh vegetables or fruit and whole grains. Drink enough fluids to keep your urine clear or pale yellow. Activity and Exercise Exercise only as directed by your health care provider. Exercising will help you: Control your weight. Stay in shape. Be prepared for labor and delivery. Experiencing pain or cramping in the lower abdomen or low back is a good sign that you should stop exercising. Check with your health care provider before continuing normal exercises. Try to avoid standing for long periods of time. Move your legs often if you must stand in one place for a long time. Avoid heavy lifting. Wear low-heeled shoes, and practice good posture. You may continue to have sex  unless your health care provider directs you otherwise. Relief of Pain or Discomfort Wear a good support bra for breast tenderness.   Take warm sitz baths to soothe any pain or discomfort caused by hemorrhoids. Use hemorrhoid cream if your health care provider approves.   Rest with your legs elevated if you have leg cramps or low back pain. If you develop varicose veins in your legs, wear support hose. Elevate your feet for 15 minutes, 3-4 times a day. Limit salt in your diet. Prenatal Care Schedule your prenatal visits by the  twelfth week of pregnancy. They are usually scheduled monthly at first, then more often in the last 2 months before delivery. Write down your questions. Take them to your prenatal visits. Keep all your prenatal visits as directed by your health care provider. Safety Wear your seat belt at all times when driving. Make a list of emergency phone numbers, including numbers for family, friends, the hospital, and police and fire departments. General Tips Ask your health care provider for a referral to a local prenatal education class. Begin classes no later than at the beginning of month 6 of your pregnancy. Ask for help if you have counseling or nutritional needs during pregnancy. Your health care provider can offer advice or refer you to specialists for help with various needs. Do not use hot tubs, steam rooms, or saunas. Do not douche or use tampons or scented sanitary pads. Do not cross your legs for long periods of time. Avoid cat litter boxes and soil used by cats. These carry germs that can cause birth defects in the baby and possibly loss of the fetus by miscarriage or stillbirth. Avoid all smoking, herbs, alcohol, and medicines not prescribed by your health care provider. Chemicals in these affect the formation and growth of the baby. Schedule a dentist appointment. At home, brush your teeth with a soft toothbrush and be gentle when you floss. SEEK MEDICAL CARE IF:   You have dizziness. You have mild pelvic cramps, pelvic pressure, or nagging pain in the abdominal area. You have persistent nausea, vomiting, or diarrhea. You have a bad smelling vaginal discharge. You have pain with urination. You notice increased swelling in your face, hands, legs, or ankles. SEEK IMMEDIATE MEDICAL CARE IF:  You have a fever. You are leaking fluid from your vagina. You have spotting or bleeding from your vagina. You have severe abdominal cramping or pain. You have rapid weight gain or loss. You vomit blood or material that looks like coffee grounds. You are exposed to [place] measles and have never had them. You are exposed to fifth disease or chickenpox. You develop a severe headache. You have shortness of breath. You have any kind of trauma, such as from a fall or a car accident. Document Released: 06/26/2001 Document Revised: 11/16/2013 Document Reviewed: 05/12/2013 [facility] Patient Information 2015 [place], [place]. This information is not intended to replace advice given to you by your health care provider. Make sure you discuss any questions you have with your health care provider.

## 2023-01-08 LAB — CYTOLOGY - PAP
Chlamydia: NEGATIVE
Comment: NEGATIVE
Comment: NORMAL
Diagnosis: NEGATIVE
Neisseria Gonorrhea: NEGATIVE

## 2023-01-08 LAB — INTEGRATED 1

## 2023-01-08 MED ORDER — METRONIDAZOLE 500 MG PO TABS: 500.0000 mg | ORAL_TABLET | Freq: Two times a day (BID) | ORAL | 0 refills | Status: DC

## 2023-01-08 NOTE — Addendum Note (Signed)
Addended by: Cheral Marker on: 01/08/2023 03:02 PM   Modules accepted: Orders

## 2023-01-09 ENCOUNTER — Encounter: Payer: Self-pay | Admitting: Women's Health

## 2023-01-09 ENCOUNTER — Encounter: Payer: Self-pay | Admitting: Obstetrics & Gynecology

## 2023-01-09 LAB — CBC/D/PLT+RPR+RH+ABO+RUBIGG...
Antibody Screen: NEGATIVE
Basophils Absolute: 0 10*3/uL (ref 0.0–0.2)
Basos: 0 %
EOS (ABSOLUTE): 0.2 10*3/uL (ref 0.0–0.4)
Eos: 3 %
HCV Ab: NONREACTIVE
HIV Screen 4th Generation wRfx: NONREACTIVE
Hematocrit: 35.9 % (ref 34.0–46.6)
Hemoglobin: 11.7 g/dL (ref 11.1–15.9)
Hepatitis B Surface Ag: NEGATIVE
Immature Grans (Abs): 0 10*3/uL (ref 0.0–0.1)
Immature Granulocytes: 1 %
Lymphocytes Absolute: 2.2 10*3/uL (ref 0.7–3.1)
Lymphs: 34 %
MCH: 26.5 pg — ABNORMAL LOW (ref 26.6–33.0)
MCHC: 32.6 g/dL (ref 31.5–35.7)
MCV: 81 fL (ref 79–97)
Monocytes Absolute: 0.4 10*3/uL (ref 0.1–0.9)
Monocytes: 6 %
Neutrophils Absolute: 3.6 10*3/uL (ref 1.4–7.0)
Neutrophils: 56 %
Platelets: 210 10*3/uL (ref 150–450)
RBC: 4.41 x10E6/uL (ref 3.77–5.28)
RDW: 13.1 % (ref 11.7–15.4)
RPR Ser Ql: NONREACTIVE
Rh Factor: POSITIVE
Rubella Antibodies, IGG: 1.66 index (ref >0.99)
WBC: 6.4 10*3/uL (ref 3.4–10.8)

## 2023-01-09 LAB — HEMOGLOBIN A1C
Est. average glucose Bld gHb Est-mCnc: 111 mg/dL
Hgb A1c MFr Bld: 5.5 % (ref 4.8–5.6)

## 2023-01-09 LAB — COMPREHENSIVE METABOLIC PANEL
ALT: 9 IU/L (ref 0–32)
AST: 11 IU/L (ref 0–40)
Albumin: 4.1 g/dL (ref 4.0–5.0)
Alkaline Phosphatase: 54 IU/L (ref 44–121)
BUN/Creatinine Ratio: 8 — ABNORMAL LOW (ref 9–23)
BUN: 6 mg/dL (ref 6–20)
Bilirubin Total: <0.2 mg/dL (ref 0.0–1.2)
CO2: 16 mmol/L — ABNORMAL LOW (ref 20–29)
Calcium: 9.7 mg/dL (ref 8.7–10.2)
Chloride: 102 mmol/L (ref 96–106)
Creatinine, Ser: 0.71 mg/dL (ref 0.57–1.00)
Globulin, Total: 3.1 g/dL (ref 1.5–4.5)
Glucose: 74 mg/dL (ref 70–99)
Potassium: 3.9 mmol/L (ref 3.5–5.2)
Sodium: 138 mmol/L (ref 134–144)
Total Protein: 7.2 g/dL (ref 6.0–8.5)
eGFR: 122 mL/min/{1.73_m2} (ref >59)

## 2023-01-09 LAB — INTEGRATED 1
Crown Rump Length: 75.4 mm
Gest. Age on Collection Date: 13.4 weeks
Maternal Age at EDD: 24 yr
Nuchal Translucency (NT): 1.9 mm
Number of Fetuses: 1
PAPP-A Value: 799.1 ng/mL
Weight: 211 [lb_av]

## 2023-01-09 LAB — URINE CULTURE

## 2023-01-09 LAB — PROTEIN / CREATININE RATIO, URINE
Creatinine, Urine: 303 mg/dL
Protein, Ur: 22.4 mg/dL
Protein/Creat Ratio: 74 mg/g creat (ref 0–200)

## 2023-01-09 LAB — HCV INTERPRETATION

## 2023-01-17 LAB — PANORAMA PRENATAL TEST FULL PANEL:PANORAMA TEST PLUS 5 ADDITIONAL MICRODELETIONS: FETAL FRACTION: 5.5

## 2023-01-28 ENCOUNTER — Encounter: Payer: Medicaid Other | Admitting: Obstetrics & Gynecology

## 2023-01-30 ENCOUNTER — Ambulatory Visit: Payer: Medicaid Other | Admitting: Obstetrics & Gynecology

## 2023-01-30 ENCOUNTER — Encounter: Payer: Self-pay | Admitting: Obstetrics & Gynecology

## 2023-01-30 VITALS — BP 136/90 | HR 103 | Wt 211.0 lb

## 2023-01-30 DIAGNOSIS — Z1379 Encounter for other screening for genetic and chromosomal anomalies: Secondary | ICD-10-CM

## 2023-01-30 DIAGNOSIS — K219 Gastro-esophageal reflux disease without esophagitis: Secondary | ICD-10-CM

## 2023-01-30 DIAGNOSIS — O10919 Unspecified pre-existing hypertension complicating pregnancy, unspecified trimester: Secondary | ICD-10-CM

## 2023-01-30 DIAGNOSIS — O0993 Supervision of high risk pregnancy, unspecified, third trimester: Secondary | ICD-10-CM

## 2023-01-30 DIAGNOSIS — Z87898 Personal history of other specified conditions: Secondary | ICD-10-CM

## 2023-01-30 DIAGNOSIS — Z3A16 16 weeks gestation of pregnancy: Secondary | ICD-10-CM

## 2023-01-30 DIAGNOSIS — O10913 Unspecified pre-existing hypertension complicating pregnancy, third trimester: Secondary | ICD-10-CM

## 2023-01-30 DIAGNOSIS — O0990 Supervision of high risk pregnancy, unspecified, unspecified trimester: Secondary | ICD-10-CM

## 2023-01-30 LAB — POCT URINALYSIS DIPSTICK OB
Blood, UA: NEGATIVE
Glucose, UA: NEGATIVE
Nitrite, UA: NEGATIVE

## 2023-01-30 MED ORDER — OMEPRAZOLE 20 MG PO CPDR: 20.0000 mg | DELAYED_RELEASE_CAPSULE | Freq: Every day | ORAL | 6 refills | Status: DC

## 2023-01-30 NOTE — Progress Notes (Signed)
HIGH-RISK PREGNANCY VISIT Patient name: Diana Schmitt MRN [id number]  Date of birth: 2001-04-13 Chief Complaint:   Routine Prenatal Visit  History of Present Illness:   Diana Schmitt is a 22 y.o. G28P1001 female at [redacted]w[redacted]d with an Estimated Date of Delivery: 07/14/23 being seen today for ongoing management of a high-risk pregnancy complicated by cHTN on labetalol 200 BID + ASA 162 and hx of FGR.    Today she reports no complaints. Contractions: Not present. Vag. Bleeding: None.   . denies leaking of fluid.      01/07/2023   10:24 AM 02/12/2019    9:09 AM 02/12/2019    9:07 AM  Depression screen PHQ 2/9  Decreased Interest 0 0 0  Down, Depressed, Hopeless 0 0 0  PHQ - 2 Score 0 0 0  Altered sleeping 0    Tired, decreased energy 0    Change in appetite 0    Feeling bad or failure about yourself  0    Trouble concentrating 0    Moving slowly or fidgety/restless 0    Suicidal thoughts 0    PHQ-9 Score 0          01/07/2023   10:24 AM  GAD 7 : Generalized Anxiety Score  Nervous, Anxious, on Edge 2  Control/stop worrying 2  Worry too much - different things 2  Trouble relaxing 1  Restless 0  Easily annoyed or irritable 0  Afraid - awful might happen 0  Total GAD 7 Score 7     Review of Systems:   Pertinent items are noted in HPI Denies abnormal vaginal discharge w/ itching/odor/irritation, headaches, visual changes, shortness of breath, chest pain, abdominal pain, severe nausea/vomiting, or problems with urination or bowel movements unless otherwise stated above. Pertinent History Reviewed:  Reviewed past medical,surgical, social, obstetrical and family history.  Reviewed problem list, medications and allergies. Physical Assessment:   Vitals:   01/30/23 0909 01/30/23 0917  BP: (!) 141/95 (!) 136/90  Pulse: 96 (!) 103  Weight: 211 lb (95.7 kg)   Body mass index is 41.21 kg/m.           Physical Examination:   General appearance: alert, well appearing, and in no  distress  Mental status: alert, oriented to person, place, and time  Skin: warm & dry   Extremities:      Cardiovascular: normal heart rate noted  Respiratory: normal respiratory effort, no distress  Abdomen: gravid, soft, non-tender  Pelvic: Cervical exam deferred         Fetal Status:          Fetal Surveillance Testing today: 152 FHR   Chaperone: N/A    Results for orders placed or performed in visit on 01/30/23 (from the past 24 hour(s))  POC Urinalysis Dipstick OB   Collection Time: 01/30/23  9:20 AM  Result Value Ref Range   Color, UA     Clarity, UA     Glucose, UA Negative Negative   Bilirubin, UA     Ketones, UA 2+    Spec Grav, UA     Blood, UA neg    pH, UA     POC,PROTEIN,UA Small (1+) Negative, Trace, Small (1+), Moderate (2+), Large (3+), 4+   Urobilinogen, UA     Nitrite, UA neg    Leukocytes, UA Small (1+) (A) Negative   Appearance     Odor      Assessment & Plan:  High-risk pregnancy: G2P1001 at [redacted]w[redacted]d with an  Estimated Date of Delivery: 07/14/23      ICD-10-CM   1. Supervision of high risk pregnancy, antepartum  O09.90 POC Urinalysis Dipstick OB    2. Chronic hypertension: labetalol 200 BID + ASA 162  O10.919     3. Genetic screening  Z13.79 INTEGRATED 2    4. History of poor fetal growth  Z87.898     5. Gastroesophageal reflux disease: Rx omeperazole  K21.9         Meds:  Meds ordered this encounter  Medications   omeprazole (PRILOSEC) 20 MG capsule    Sig: Take 1 capsule (20 mg total) by mouth daily. 1 tablet a day    Dispense:  30 capsule    Refill:  6    Orders:  Orders Placed This Encounter  Procedures   INTEGRATED 2   POC Urinalysis Dipstick OB     Labs/procedures today: 2nd IT  Treatment Plan:  routine care for now, sonogram 3 weeks  Reviewed: ordered BP cuff  Follow-up: Return for keep scheduled.   Future Appointments  Date Time Provider Department Center  02/25/2023  8:30 AM [facility] - [facility] [place] [facility] None   02/25/2023  9:30 AM Myna Hidalgo, DO [facility]    Orders Placed This Encounter  Procedures   INTEGRATED 2   POC Urinalysis Dipstick OB   Lazaro Arms  Attending Physician for the Center for [facility] 01/30/2023 9:57 AM

## 2023-02-01 LAB — INTEGRATED 2
AFP MoM: 1.63
Alpha-Fetoprotein: 48.3 ng/mL
Crown Rump Length: 75.4 mm
DIA MoM: 0.69
DIA Value: 86.8 pg/mL
Estriol, Unconjugated: 1.05 ng/mL
Gest. Age on Collection Date: 13.4 weeks
Gestational Age: 16.7 weeks
Maternal Age at EDD: 24 yr
Nuchal Translucency (NT): 1.9 mm
Nuchal Translucency MoM: 1.09
Number of Fetuses: 1
PAPP-A MoM: 0.85
PAPP-A Value: 799.1 ng/mL
Test Results:: NEGATIVE
Weight: 211 [lb_av]
Weight: 211 [lb_av]
hCG MoM: 1.25
hCG Value: 29.5 IU/mL
uE3 MoM: 1.05

## 2023-02-07 ENCOUNTER — Encounter: Payer: Self-pay | Admitting: Women's Health

## 2023-02-11 ENCOUNTER — Encounter: Payer: Self-pay | Admitting: Obstetrics & Gynecology

## 2023-02-15 ENCOUNTER — Encounter: Payer: Self-pay | Admitting: Obstetrics & Gynecology

## 2023-02-18 ENCOUNTER — Encounter: Payer: Medicaid Other | Admitting: Obstetrics and Gynecology

## 2023-02-22 ENCOUNTER — Other Ambulatory Visit: Payer: Self-pay | Admitting: Obstetrics & Gynecology

## 2023-02-22 DIAGNOSIS — Z363 Encounter for antenatal screening for malformations: Secondary | ICD-10-CM

## 2023-02-25 ENCOUNTER — Other Ambulatory Visit: Payer: Medicaid Other

## 2023-02-25 ENCOUNTER — Encounter: Payer: Self-pay | Admitting: Obstetrics & Gynecology

## 2023-02-25 ENCOUNTER — Ambulatory Visit: Payer: Medicaid Other | Admitting: Obstetrics & Gynecology

## 2023-02-25 VITALS — BP 129/82 | HR 92 | Wt 211.0 lb

## 2023-02-25 DIAGNOSIS — O0991 Supervision of high risk pregnancy, unspecified, first trimester: Secondary | ICD-10-CM

## 2023-02-25 DIAGNOSIS — Z363 Encounter for antenatal screening for malformations: Secondary | ICD-10-CM

## 2023-02-25 DIAGNOSIS — Z3A20 20 weeks gestation of pregnancy: Secondary | ICD-10-CM

## 2023-02-25 DIAGNOSIS — O10912 Unspecified pre-existing hypertension complicating pregnancy, second trimester: Secondary | ICD-10-CM

## 2023-02-25 DIAGNOSIS — O0992 Supervision of high risk pregnancy, unspecified, second trimester: Secondary | ICD-10-CM

## 2023-02-25 DIAGNOSIS — O10919 Unspecified pre-existing hypertension complicating pregnancy, unspecified trimester: Secondary | ICD-10-CM

## 2023-02-25 NOTE — Progress Notes (Signed)
[place] 20+1 wks,breech,posterior placenta gr 0,normal ovaries,SVP of fluid 5.1 cm,FHR 146 bpm,CX 4.3 cm,EFW 318 g 30%,anatomy complete,no obvious abnormalities

## 2023-02-25 NOTE — Progress Notes (Signed)
HIGH-RISK PREGNANCY VISIT Patient name: Diana Schmitt MRN [id number]  Date of birth: 2000/12/25 Chief Complaint:   Routine Prenatal Visit and Pregnancy Ultrasound  History of Present Illness:   Diana Schmitt is a 22 y.o. G71P1001 female at [redacted]w[redacted]d with an Estimated Date of Delivery: 07/14/23 being seen today for ongoing management of a high-risk pregnancy complicated by:  -Chronic HTN- on labetalol 200mg  bid -morbid obesity  Today she reports no complaints.   Contractions: Not present.  Denies vaginal bleeding .  Movement: Present. denies leaking of fluid.      01/07/2023   10:24 AM 02/12/2019    9:09 AM 02/12/2019    9:07 AM  Depression screen PHQ 2/9  Decreased Interest 0 0 0  Down, Depressed, Hopeless 0 0 0  PHQ - 2 Score 0 0 0  Altered sleeping 0    Tired, decreased energy 0    Change in appetite 0    Feeling bad or failure about yourself  0    Trouble concentrating 0    Moving slowly or fidgety/restless 0    Suicidal thoughts 0    PHQ-9 Score 0       Current Outpatient Medications  Medication Instructions   aspirin EC 162 mg, Oral, Daily, Swallow whole.   Blood Pressure Monitor MISC For regular home bp monitoring during pregnancy   famotidine (PEPCID) 20 mg, Oral, Daily   labetalol (NORMODYNE) 200 mg, Oral, 2 times daily   metroNIDAZOLE (FLAGYL) 500 mg, Oral, 2 times daily   omeprazole (PRILOSEC) 20 mg, Oral, Daily, 1 tablet a day   ondansetron (ZOFRAN) 4 mg, Oral, Every 8 hours PRN   Prenatal Vit-Fe Fumarate-FA (PREPLUS) 27-1 MG TABS 1 tablet, Oral, Daily     Review of Systems:   Pertinent items are noted in HPI Denies abnormal vaginal discharge w/ itching/odor/irritation, headaches, visual changes, shortness of breath, chest pain, abdominal pain, severe nausea/vomiting, or problems with urination or bowel movements unless otherwise stated above. Pertinent History Reviewed:  Reviewed past medical,surgical, social, obstetrical and family history.  Reviewed problem  list, medications and allergies. Physical Assessment:   Vitals:   02/25/23 0932  BP: 129/82  Pulse: 92  Weight: 211 lb (95.7 kg)  Body mass index is 41.21 kg/m.           Physical Examination:   General appearance: alert, well appearing, and in no distress  Mental status: normal mood, behavior, speech, dress, motor activity, and thought processes  Skin: warm & dry   Extremities: Edema: None    Cardiovascular: normal heart rate noted  Respiratory: normal respiratory effort, no distress  Abdomen: gravid, soft, non-tender  Pelvic: Cervical exam deferred         Fetal Status:     Movement: Present    Fetal Surveillance Testing today: breech,posterior placenta gr 0,normal ovaries,SVP of fluid 5.1 cm,FHR 146 bpm,CX 4.3 cm,EFW 318 g 30%,anatomy complete,no obvious abnormalities    Chaperone: N/A    No results found for this or any previous visit (from the past 24 hour(s)).   Assessment & Plan:  High-risk pregnancy: G2P1001 at [redacted]w[redacted]d with an Estimated Date of Delivery: 07/14/23   1) Chronic HTN -Doing well with current medication -Continue growth every 4 weeks -Plan to start antepartum testing around 32 weeks  2) contraceptive management -Not sure about her options but will likely use condoms  Meds: No orders of the defined types were placed in this encounter.   Labs/procedures today: Anatomy scan  Treatment Plan: Routine  OB care and as outlined above.  Patient given BP cuff from office today  Reviewed: Preterm labor symptoms and general obstetric precautions including but not limited to vaginal bleeding, contractions, leaking of fluid and fetal movement were reviewed in detail with the patient.  All questions were answered.  Patient given home bp cuff. Check bp weekly, let us know if >140/90.   Follow-up: Return in about 4 weeks (around 03/25/2023) for HROB visit and growth scan (every 4wk).   Future Appointments  Date Time Provider Department Center  03/27/2023  3:45 PM  [facility] - [facility] [place] [facility] None  03/27/2023  4:30 PM Myna Hidalgo, DO [facility]     Orders Placed This Encounter  Procedures   US OB Follow Up    Myna Hidalgo, DO Attending Obstetrician & Gynecologist, [facility] for [employer], [facility]

## 2023-03-19 ENCOUNTER — Other Ambulatory Visit: Payer: Medicaid Other

## 2023-03-20 ENCOUNTER — Encounter: Payer: Self-pay | Admitting: Obstetrics & Gynecology

## 2023-03-27 ENCOUNTER — Encounter: Payer: Medicaid Other | Admitting: Obstetrics & Gynecology

## 2023-03-27 ENCOUNTER — Ambulatory Visit (INDEPENDENT_AMBULATORY_CARE_PROVIDER_SITE_OTHER): Payer: Medicaid Other | Admitting: Obstetrics & Gynecology

## 2023-03-27 ENCOUNTER — Ambulatory Visit (INDEPENDENT_AMBULATORY_CARE_PROVIDER_SITE_OTHER): Payer: Medicaid Other

## 2023-03-27 ENCOUNTER — Encounter: Payer: Self-pay | Admitting: Obstetrics & Gynecology

## 2023-03-27 VITALS — BP 125/80 | HR 93 | Wt 208.5 lb

## 2023-03-27 DIAGNOSIS — Z3A24 24 weeks gestation of pregnancy: Secondary | ICD-10-CM

## 2023-03-27 DIAGNOSIS — O10912 Unspecified pre-existing hypertension complicating pregnancy, second trimester: Secondary | ICD-10-CM | POA: Diagnosis not present

## 2023-03-27 DIAGNOSIS — O0992 Supervision of high risk pregnancy, unspecified, second trimester: Secondary | ICD-10-CM

## 2023-03-27 DIAGNOSIS — O10919 Unspecified pre-existing hypertension complicating pregnancy, unspecified trimester: Secondary | ICD-10-CM

## 2023-03-27 NOTE — Progress Notes (Signed)
[place] 24+3 wks,cephalic,cx 4.2 cm,posterior placenta gr 0,SVP of fluid 5.3 cm,normal ovaries,FHR 137 bpm,EFW 733 g 56%

## 2023-03-27 NOTE — Progress Notes (Signed)
HIGH-RISK PREGNANCY VISIT Patient name: Diana Schmitt MRN [id number]  Date of birth: [birth year] Chief Complaint:   High Risk Gestation ([place] today; wants to know what she can take to lessen yeast infections)  History of Present Illness:   Diana Schmitt is a 22 y.o. G63P1001 female at [redacted]w[redacted]d with an Estimated Date of Delivery: 07/14/23 being seen today for ongoing management of a high-risk pregnancy complicated by:  Chronic HTN: On Labetalol 100mg  bid Per patient Home BP mostly 120-130/70s.  Asymptomatic  Morbid obesity  Today she reports no complaints.   Contractions: Irritability. Vag. Bleeding: None.  Movement: Present. denies leaking of fluid.      01/07/2023   10:24 AM 02/12/2019    9:09 AM 02/12/2019    9:07 AM  Depression screen PHQ 2/9  Decreased Interest 0 0 0  Down, Depressed, Hopeless 0 0 0  PHQ - 2 Score 0 0 0  Altered sleeping 0    Tired, decreased energy 0    Change in appetite 0    Feeling bad or failure about yourself  0    Trouble concentrating 0    Moving slowly or fidgety/restless 0    Suicidal thoughts 0    PHQ-9 Score 0       Current Outpatient Medications  Medication Instructions   aspirin EC 162 mg, Oral, Daily, Swallow whole.   Blood Pressure Monitor MISC For regular home bp monitoring during pregnancy   famotidine (PEPCID) 20 mg, Oral, Daily   labetalol (NORMODYNE) 200 mg, Oral, 2 times daily   omeprazole (PRILOSEC) 20 mg, Oral, Daily, 1 tablet a day   Prenatal Vit-Fe Fumarate-FA (PREPLUS) 27-1 MG TABS 1 tablet, Oral, Daily     Review of Systems:   Pertinent items are noted in HPI Denies abnormal vaginal discharge w/ itching/odor/irritation, headaches, visual changes, shortness of breath, chest pain, abdominal pain, severe nausea/vomiting, or problems with urination or bowel movements unless otherwise stated above. Pertinent History Reviewed:  Reviewed past medical,surgical, social, obstetrical and family history.  Reviewed problem list, medications  and allergies. Physical Assessment:   Vitals:   03/27/23 1136 03/27/23 1311  BP: (!) 143/82 125/80  Pulse: 93   Weight: 208 lb 8 oz (94.6 kg)   Body mass index is 40.72 kg/m.     Repeat BP: 125/80       Physical Examination:   General appearance: alert, well appearing, and in no distress  Mental status: normal mood, behavior, speech, dress, motor activity, and thought processes  Skin: warm & dry   Extremities: Edema: None    Cardiovascular: normal heart rate noted  Respiratory: normal respiratory effort, no distress  Abdomen: gravid, soft, non-tender  Pelvic: Cervical exam deferred         Fetal Status:     Movement: Present    Fetal Surveillance Testing today: cephalic,cx 4.2 cm,posterior placenta gr 0,SVP of fluid 5.3 cm,normal ovaries,FHR 137 bpm,EFW 733 g 56%    Chaperone: N/A    No results found for this or any previous visit (from the past 24 hour(s)).   Assessment & Plan:  High-risk pregnancy: G2P1001 at [redacted]w[redacted]d with an Estimated Date of Delivery: 07/14/23   1) Chronic HTN -repeat BP within normal limits, she notes being anxious today -continue with current meds -Reviewed today's ultrasound, AGA -Continue growth every 4 weeks with plans for antepartum testing at 32 weeks   Meds: No orders of the defined types were placed in this encounter.   Labs/procedures today: growth  Treatment  Plan:  as outlined above  Reviewed: Preterm labor symptoms and general obstetric precautions including but not limited to vaginal bleeding, contractions, leaking of fluid and fetal movement were reviewed in detail with the patient.  All questions were answered. Pt has home bp cuff. Check bp weekly, let us know if >140/90.   Follow-up: Return in about 4 weeks (around 04/24/2023) for HROB visit, PN2 and growth.   Future Appointments  Date Time Provider Department Center  04/24/2023  8:50 AM [facility]  04/24/2023  9:10 AM Myna Hidalgo, DO [facility]    No  orders of the defined types were placed in this encounter.   Myna Hidalgo, DO Attending Obstetrician & Gynecologist, [facility] for [employer], [facility]

## 2023-04-24 ENCOUNTER — Encounter: Payer: Self-pay | Admitting: Obstetrics & Gynecology

## 2023-04-24 ENCOUNTER — Ambulatory Visit (INDEPENDENT_AMBULATORY_CARE_PROVIDER_SITE_OTHER): Payer: Medicaid Other | Admitting: Obstetrics & Gynecology

## 2023-04-24 ENCOUNTER — Other Ambulatory Visit: Payer: Medicaid Other

## 2023-04-24 VITALS — BP 132/82 | HR 91 | Wt 210.0 lb

## 2023-04-24 DIAGNOSIS — Z3A28 28 weeks gestation of pregnancy: Secondary | ICD-10-CM

## 2023-04-24 DIAGNOSIS — O10919 Unspecified pre-existing hypertension complicating pregnancy, unspecified trimester: Secondary | ICD-10-CM

## 2023-04-24 DIAGNOSIS — O0993 Supervision of high risk pregnancy, unspecified, third trimester: Secondary | ICD-10-CM

## 2023-04-24 DIAGNOSIS — O0992 Supervision of high risk pregnancy, unspecified, second trimester: Secondary | ICD-10-CM

## 2023-04-24 DIAGNOSIS — O10913 Unspecified pre-existing hypertension complicating pregnancy, third trimester: Secondary | ICD-10-CM

## 2023-04-24 DIAGNOSIS — Z131 Encounter for screening for diabetes mellitus: Secondary | ICD-10-CM | POA: Diagnosis not present

## 2023-04-24 NOTE — Progress Notes (Signed)
HIGH-RISK PREGNANCY VISIT Patient name: Krishika Bugge MRN [id number]  Date of birth: 2001/04/17 Chief Complaint:   Routine Prenatal Visit  History of Present Illness:   Diana Schmitt is a 22 y.o. G7P1001 female at [redacted]w[redacted]d with an Estimated Date of Delivery: 07/14/23 being seen today for ongoing management of a high-risk pregnancy complicated by:  -Chronic HTN- on Labetalol 200mg  bid  Today she reports no complaints.   Contractions: Not present. Vag. Bleeding: None.  Movement: Present. denies leaking of fluid.      01/07/2023   10:24 AM 02/12/2019    9:09 AM 02/12/2019    9:07 AM  Depression screen PHQ 2/9  Decreased Interest 0 0 0  Down, Depressed, Hopeless 0 0 0  PHQ - 2 Score 0 0 0  Altered sleeping 0    Tired, decreased energy 0    Change in appetite 0    Feeling bad or failure about yourself  0    Trouble concentrating 0    Moving slowly or fidgety/restless 0    Suicidal thoughts 0    PHQ-9 Score 0       Current Outpatient Medications  Medication Instructions   aspirin EC 162 mg, Oral, Daily, Swallow whole.   Blood Pressure Monitor MISC For regular home bp monitoring during pregnancy   famotidine (PEPCID) 20 mg, Oral, Daily   labetalol (NORMODYNE) 200 mg, Oral, 2 times daily   omeprazole (PRILOSEC) 20 mg, Oral, Daily, 1 tablet a day   Prenatal Vit-Fe Fumarate-FA (PREPLUS) 27-1 MG TABS 1 tablet, Oral, Daily     Review of Systems:   Pertinent items are noted in HPI Denies abnormal vaginal discharge w/ itching/odor/irritation, headaches, visual changes, shortness of breath, chest pain, abdominal pain, severe nausea/vomiting, or problems with urination or bowel movements unless otherwise stated above. Pertinent History Reviewed:  Reviewed past medical,surgical, social, obstetrical and family history.  Reviewed problem list, medications and allergies. Physical Assessment:   Vitals:   04/24/23 0921  BP: 132/82  Pulse: 91  Weight: 210 lb (95.3 kg)  Body mass index is  41.01 kg/m.           Physical Examination:   General appearance: alert, well appearing, and in no distress  Mental status: normal mood, behavior, speech, dress, motor activity, and thought processes  Skin: warm & dry   Extremities:      Cardiovascular: normal heart rate noted  Respiratory: normal respiratory effort, no distress  Abdomen: gravid, soft, non-tender  Pelvic: Cervical exam deferred         Fetal Status: Fetal Heart Rate (bpm): 140 Fundal Height: 30 cm Movement: Present    Fetal Surveillance Testing today: doppler   Chaperone: N/A    No results found for this or any previous visit (from the past 24 hour(s)).   Assessment & Plan:  High-risk pregnancy: G2P1001 at [redacted]w[redacted]d with an Estimated Date of Delivery: 07/14/23   1) Chronic HTN -last growth 9/11, plan to schedule at next available then q 4wks -antepartum testing @ 32wks -continue with current meds  -declined flu shot -reviewed contraception, prenatal classes  Meds: No orders of the defined types were placed in this encounter.   Labs/procedures today: doppler, PN2  Treatment Plan:  as outlined above  Reviewed: Preterm labor symptoms and general obstetric precautions including but not limited to vaginal bleeding, contractions, leaking of fluid and fetal movement were reviewed in detail with the patient.  All questions were answered. Pt has home bp cuff. Check bp weekly,  let us know if >140/90.   Follow-up: Return in about 2 weeks (around 05/08/2023) for HROB/growth (every 4wks) then in 4wks (32wk)- NST/BPP weekly (ok for CNM).   No future appointments.  No orders of the defined types were placed in this encounter.   Myna Hidalgo, DO Attending Obstetrician & Gynecologist, [facility] for [employer], [facility]

## 2023-04-25 LAB — CBC
Hematocrit: 32.7 % — ABNORMAL LOW (ref 34.0–46.6)
Hemoglobin: 10.4 g/dL — ABNORMAL LOW (ref 11.1–15.9)
MCH: 26.5 pg — ABNORMAL LOW (ref 26.6–33.0)
MCHC: 31.8 g/dL (ref 31.5–35.7)
MCV: 83 fL (ref 79–97)
Platelets: 175 10*3/uL (ref 150–450)
RBC: 3.92 x10E6/uL (ref 3.77–5.28)
RDW: 13.2 % (ref 11.7–15.4)
WBC: 6 10*3/uL (ref 3.4–10.8)

## 2023-04-25 LAB — GLUCOSE TOLERANCE, 2 HOURS W/ 1HR
Glucose, 1 hour: 135 mg/dL (ref 70–179)
Glucose, 2 hour: 73 mg/dL (ref 70–152)
Glucose, Fasting: 73 mg/dL (ref 70–91)

## 2023-04-25 LAB — ANTIBODY SCREEN: Antibody Screen: NEGATIVE

## 2023-04-25 LAB — RPR: RPR Ser Ql: NONREACTIVE

## 2023-04-25 LAB — HIV ANTIBODY (ROUTINE TESTING W REFLEX): HIV Screen 4th Generation wRfx: NONREACTIVE

## 2023-04-30 ENCOUNTER — Encounter: Payer: Self-pay | Admitting: Advanced Practice Midwife

## 2023-05-07 ENCOUNTER — Other Ambulatory Visit: Payer: Self-pay | Admitting: Obstetrics & Gynecology

## 2023-05-07 DIAGNOSIS — O10919 Unspecified pre-existing hypertension complicating pregnancy, unspecified trimester: Secondary | ICD-10-CM

## 2023-05-07 DIAGNOSIS — Z87898 Personal history of other specified conditions: Secondary | ICD-10-CM

## 2023-05-08 ENCOUNTER — Ambulatory Visit (INDEPENDENT_AMBULATORY_CARE_PROVIDER_SITE_OTHER): Payer: Medicaid Other | Admitting: Advanced Practice Midwife

## 2023-05-08 ENCOUNTER — Encounter: Payer: Self-pay | Admitting: Advanced Practice Midwife

## 2023-05-08 ENCOUNTER — Ambulatory Visit: Payer: Medicaid Other

## 2023-05-08 VITALS — BP 137/89 | HR 97 | Wt 210.0 lb

## 2023-05-08 DIAGNOSIS — O10913 Unspecified pre-existing hypertension complicating pregnancy, third trimester: Secondary | ICD-10-CM

## 2023-05-08 DIAGNOSIS — Z3A30 30 weeks gestation of pregnancy: Secondary | ICD-10-CM

## 2023-05-08 DIAGNOSIS — O0992 Supervision of high risk pregnancy, unspecified, second trimester: Secondary | ICD-10-CM

## 2023-05-08 DIAGNOSIS — O10919 Unspecified pre-existing hypertension complicating pregnancy, unspecified trimester: Secondary | ICD-10-CM

## 2023-05-08 DIAGNOSIS — O0993 Supervision of high risk pregnancy, unspecified, third trimester: Secondary | ICD-10-CM

## 2023-05-08 NOTE — Progress Notes (Signed)
HIGH-RISK PREGNANCY VISIT Patient name: Diana Schmitt MRN [id number]  Date of birth: Dec 08, 2000 Chief Complaint:   Routine Prenatal Visit  History of Present Illness:   Diana Schmitt is a 22 y.o. G67P1001 female at [redacted]w[redacted]d with an Estimated Date of Delivery: 07/14/23 being seen today for ongoing management of a high-risk pregnancy complicated by chronic hypertension currently on Labetalol 200mg  bid.    Today she reports no complaints. Contractions: Not present. Vag. Bleeding: None.  Movement: Present. denies leaking of fluid.      01/07/2023   10:24 AM 02/12/2019    9:09 AM 02/12/2019    9:07 AM  Depression screen PHQ 2/9  Decreased Interest 0 0 0  Down, Depressed, Hopeless 0 0 0  PHQ - 2 Score 0 0 0  Altered sleeping 0    Tired, decreased energy 0    Change in appetite 0    Feeling bad or failure about yourself  0    Trouble concentrating 0    Moving slowly or fidgety/restless 0    Suicidal thoughts 0    PHQ-9 Score 0          01/07/2023   10:24 AM  GAD 7 : Generalized Anxiety Score  Nervous, Anxious, on Edge 2  Control/stop worrying 2  Worry too much - different things 2  Trouble relaxing 1  Restless 0  Easily annoyed or irritable 0  Afraid - awful might happen 0  Total GAD 7 Score 7     Review of Systems:   Pertinent items are noted in HPI Denies abnormal vaginal discharge w/ itching/odor/irritation, headaches, visual changes, shortness of breath, chest pain, abdominal pain, severe nausea/vomiting, or problems with urination or bowel movements unless otherwise stated above. Pertinent History Reviewed:  Reviewed past medical,surgical, social, obstetrical and family history.  Reviewed problem list, medications and allergies. Physical Assessment:   Vitals:   05/08/23 1501  BP: 137/89  Pulse: 97  Weight: 210 lb (95.3 kg)  Body mass index is 41.01 kg/m.           Physical Examination:   General appearance: alert, well appearing, and in no distress  Mental status:  alert, oriented to person, place, and time  Skin: warm & dry   Extremities: Edema: None    Cardiovascular: normal heart rate noted  Respiratory: normal respiratory effort, no distress  Abdomen: gravid, soft, non-tender  Pelvic: Cervical exam deferred         Fetal Status: Fetal Heart Rate (bpm): 135 u/s   Movement: Present    Fetal Surveillance Testing today: [place] 30+3 wks,cephalic,cx 3.3 cm,posterior placenta gr 3,AFI 19 cm,FHR 135 bpm,EFW 1528 g 30%     No results found for this or any previous visit (from the past 24 hour(s)).  Assessment & Plan:  High-risk pregnancy: G2P1001 at [redacted]w[redacted]d with an Estimated Date of Delivery: 07/14/23   1) cHTN, stable on Lab 200mg  bid; will call if getting values significantly at/over 140/90; 2x/wk testing all set up to starting @ 32wks  2) Hx FGR prev preg, EFW 30% now  Meds: No orders of the defined types were placed in this encounter.   Labs/procedures today: U/S (declined flu and Tdap)  Treatment Plan:  starting ante testing @ 32wks; IOL 37-39wks  Reviewed: Preterm labor symptoms and general obstetric precautions including but not limited to vaginal bleeding, contractions, leaking of fluid and fetal movement were reviewed in detail with the patient.  All questions were answered. Does have home bp cuff. Office  bp cuff given: not applicable. Check bp daily, let us know if consistently >140 and/or >90.  Follow-up: Return for As scheduled.   Future Appointments  Date Time Provider Department Center  05/20/2023  8:30 AM [facility] - FT IMG 2 [facility] None  05/20/2023  9:30 AM Cheral Marker, CNM [facility]  05/23/2023  9:50 AM [facility] NURSE [facility]  05/27/2023 10:30 AM [facility] - FT IMG 2 [facility] None  05/27/2023 11:50 AM Jacklyn Shell, CNM [facility]  05/30/2023  9:30 AM [facility] NURSE [facility]  06/03/2023  8:30 AM [facility] - FT IMG 2 [facility] None  06/03/2023  9:50 AM Cheral Marker, CNM [facility]   06/03/2023 10:10 AM Cheral Marker, CNM [facility]  06/06/2023  9:10 AM [facility] NURSE [facility]  06/10/2023 10:00 AM [facility] - [facility] [place] [facility] None  06/10/2023 10:50 AM Myna Hidalgo, DO [facility]  06/17/2023  8:30 AM [facility] - FT IMG 2 [facility] None  06/17/2023  9:30 AM Myna Hidalgo, DO [facility]  06/20/2023 10:10 AM [facility] NURSE [facility]  06/24/2023  8:30 AM [facility] - FT IMG 2 [facility] None  06/24/2023  9:30 AM Jacklyn Shell, CNM [facility]  06/27/2023  9:50 AM [facility] NURSE [facility]  07/01/2023  8:30 AM [facility] - FT IMG 2 [facility] None  07/01/2023  9:30 AM Myna Hidalgo, DO [facility]  07/04/2023 10:10 AM [facility] NURSE [facility]  07/08/2023  9:10 AM [facility] NURSE [facility]  07/08/2023  9:30 AM Cheral Marker, CNM [facility]  07/11/2023 10:50 AM [facility] NURSE [facility]    No orders of the defined types were placed in this encounter.  Arabella Merles CNM 05/08/2023 3:03 PM

## 2023-05-08 NOTE — Patient Instructions (Signed)
Claudia, thank you for choosing our office today! We appreciate the opportunity to meet your healthcare needs. You may receive a short survey by mail, e-mail, or through [employer]. If you are happy with your care we would appreciate if you could take just a few minutes to complete the survey questions. We read all of your comments and take your feedback very seriously. Thank you again for choosing our office.  Center for [employer] Team at [facility]  [facility] at [facility] ([address] [place], [place] [address]) Entrance C, located off of [address] Free 24/7 valet parking   CLASSES: Go to [email or web address] to register for classes (childbirth, breastfeeding, waterbirth, infant CPR, daddy bootcamp, etc.)  Call the office [phone]) or go to [facility] if: You begin to have strong, frequent contractions Your water breaks.  Sometimes it is a big gush of fluid, sometimes it is just a trickle that keeps getting your panties wet or running down your legs You have vaginal bleeding.  It is normal to have a small amount of spotting if your cervix was checked.  You don't feel your baby moving like normal.  If you don't, get you something to eat and drink and lay down and focus on feeling your baby move.   If your baby is still not moving like normal, you should call the office or go to [facility].  Call the office [phone]) or go to [facility] hospital for these signs of pre-eclampsia: Severe headache that does not go away with Tylenol Visual changes- seeing spots, double, blurred vision Pain under your right breast or upper abdomen that does not go away with Tums or heartburn medicine Nausea and/or vomiting Severe swelling in your hands, feet, and face   Tdap Vaccine It is recommended that you get the Tdap vaccine during the third trimester of EACH pregnancy to help protect your baby from getting pertussis (whooping cough) 27-36 weeks is the BEST time to do this  so that you can pass the protection on to your baby. During pregnancy is better than after pregnancy, but if you are unable to get it during pregnancy it will be offered at the hospital.  You can get this vaccine with [place], at the health department, your family doctor, or some local pharmacies Everyone who will be around your baby should also be up-to-date on their vaccines before the baby comes. Adults (who are not pregnant) only need 1 dose of Tdap during adulthood.   [facility] Pediatricians/Family Doctors [facility]): [address] Colette Ribas, [phone]           [facility]: [address], [phone]                [facility] Medicine [facility]): [address], [phone] (call to ask if accepting patients) [facility] Department: [address], [place], [phone]    [facility] Pediatricians/Family Doctors [facility]): [phone] [address], [phone] [facility]: [address] [place], [phone] [facility]: [address], [phone]  [facility] Doctors  [facility]): [phone] [facility]: [address] [phone]   [facility] Doctors [facility]: [address] [phone]  [facility] Family Doctors  [facility]: [phone], [phone]  Home Blood Pressure Monitoring for Patients   Your provider has recommended that you check your  blood pressure (BP) at least once a week at home. If you do not have a blood pressure cuff at home, one will be provided for you. Contact your provider if you have not received your monitor within 1 week.   Helpful Tips for Accurate Home Blood Pressure Checks  Don't smoke, exercise, or drink caffeine 30 minutes before checking your BP Use the restroom before checking your BP (a full bladder can raise your  pressure) Relax in a comfortable upright chair Feet on the ground Left arm resting comfortably on a flat surface at the level of your heart Legs uncrossed Back supported Sit quietly and don't talk Place the cuff on your bare arm Adjust snuggly, so that only two fingertips can fit between your skin and the top of the cuff Check 2 readings separated by at least one minute Keep a log of your BP readings For a visual, please reference this diagram: http://ccnc.care/bpdiagram  Provider Name: [facility]     Phone: [phone]  Zone 1: ALL CLEAR  Continue to monitor your symptoms:  BP reading is less than 140 (top number) or less than 90 (bottom number)  No right upper stomach pain No headaches or seeing spots No feeling nauseated or throwing up No swelling in face and hands  Zone 2: CAUTION Call your doctor's office for any of the following:  BP reading is greater than 140 (top number) or greater than 90 (bottom number)  Stomach pain under your ribs in the middle or right side Headaches or seeing spots Feeling nauseated or throwing up Swelling in face and hands  Zone 3: EMERGENCY  Seek immediate medical care if you have any of the following:  BP reading is greater than160 (top number) or greater than 110 (bottom number) Severe headaches not improving with Tylenol Serious difficulty catching your breath Any worsening symptoms from Zone 2   Third Trimester of Pregnancy The third trimester is from week 29 through week 42, months 7 through 9. The third trimester is a time when the fetus is growing rapidly. At the end of the ninth month, the fetus is about 20 inches in length and weighs 6-10 pounds.  BODY CHANGES Your body goes through many changes during pregnancy. The changes vary from woman to woman.  Your weight will continue to increase. You can expect to gain 25-35 pounds (11-16 kg) by the end of the pregnancy. You may begin to get stretch marks on your hips, abdomen,  and breasts. You may urinate more often because the fetus is moving lower into your pelvis and pressing on your bladder. You may develop or continue to have heartburn as a result of your pregnancy. You may develop constipation because certain hormones are causing the muscles that push waste through your intestines to slow down. You may develop hemorrhoids or swollen, bulging veins (varicose veins). You may have pelvic pain because of the weight gain and pregnancy hormones relaxing your joints between the bones in your pelvis. Backaches may result from overexertion of the muscles supporting your posture. You may have changes in your hair. These can include thickening of your hair, rapid growth, and changes in texture. Some women also have hair loss during or after pregnancy, or hair that feels dry or thin. Your hair will most likely return to normal after your baby is born. Your breasts will continue to grow and be tender. A yellow discharge may leak from your breasts called colostrum. Your belly button may stick out. You may  feel short of breath because of your expanding uterus. You may notice the fetus "dropping," or moving lower in your abdomen. You may have a bloody mucus discharge. This usually occurs a few days to a week before labor begins. Your cervix becomes thin and soft (effaced) near your due date. WHAT TO EXPECT AT YOUR PRENATAL EXAMS  You will have prenatal exams every 2 weeks until week 36. Then, you will have weekly prenatal exams. During a routine prenatal visit: You will be weighed to make sure you and the fetus are growing normally. Your blood pressure is taken. Your abdomen will be measured to track your baby's growth. The fetal heartbeat will be listened to. Any test results from the previous visit will be discussed. You may have a cervical check near your due date to see if you have effaced. At around 36 weeks, your caregiver will check your cervix. At the same time, your  caregiver will also perform a test on the secretions of the vaginal tissue. This test is to determine if a type of bacteria, Group B streptococcus, is present. Your caregiver will explain this further. Your caregiver may ask you: What your birth plan is. How you are feeling. If you are feeling the baby move. If you have had any abnormal symptoms, such as leaking fluid, bleeding, severe headaches, or abdominal cramping. If you have any questions. Other tests or screenings that may be performed during your third trimester include: Blood tests that check for low iron levels (anemia). Fetal testing to check the health, activity level, and growth of the fetus. Testing is done if you have certain medical conditions or if there are problems during the pregnancy. FALSE LABOR You may feel small, irregular contractions that eventually go away. These are called Braxton Hicks contractions, or false labor. Contractions may last for hours, days, or even weeks before true labor sets in. If contractions come at regular intervals, intensify, or become painful, it is best to be seen by your caregiver.  SIGNS OF LABOR  Menstrual-like cramps. Contractions that are 5 minutes apart or less. Contractions that start on the top of the uterus and spread down to the lower abdomen and back. A sense of increased pelvic pressure or back pain. A watery or bloody mucus discharge that comes from the vagina. If you have any of these signs before the 37th week of pregnancy, call your caregiver right away. You need to go to the hospital to get checked immediately. HOME CARE INSTRUCTIONS  Avoid all smoking, herbs, alcohol, and unprescribed drugs. These chemicals affect the formation and growth of the baby. Follow your caregiver's instructions regarding medicine use. There are medicines that are either safe or unsafe to take during pregnancy. Exercise only as directed by your caregiver. Experiencing uterine cramps is a good sign to  stop exercising. Continue to eat regular, healthy meals. Wear a good support bra for breast tenderness. Do not use hot tubs, steam rooms, or saunas. Wear your seat belt at all times when driving. Avoid raw meat, uncooked cheese, cat litter boxes, and soil used by cats. These carry germs that can cause birth defects in the baby. Take your prenatal vitamins. Try taking a stool softener (if your caregiver approves) if you develop constipation. Eat more high-fiber foods, such as fresh vegetables or fruit and whole grains. Drink plenty of fluids to keep your urine clear or pale yellow. Take warm sitz baths to soothe any pain or discomfort caused by hemorrhoids. Use hemorrhoid cream if  your caregiver approves. If you develop varicose veins, wear support hose. Elevate your feet for 15 minutes, 3-4 times a day. Limit salt in your diet. Avoid heavy lifting, wear low heal shoes, and practice good posture. Rest a lot with your legs elevated if you have leg cramps or low back pain. Visit your dentist if you have not gone during your pregnancy. Use a soft toothbrush to brush your teeth and be gentle when you floss. A sexual relationship may be continued unless your caregiver directs you otherwise. Do not travel far distances unless it is absolutely necessary and only with the approval of your caregiver. Take prenatal classes to understand, practice, and ask questions about the labor and delivery. Make a trial run to the hospital. Pack your hospital bag. Prepare the baby's nursery. Continue to go to all your prenatal visits as directed by your caregiver. SEEK MEDICAL CARE IF: You are unsure if you are in labor or if your water has broken. You have dizziness. You have mild pelvic cramps, pelvic pressure, or nagging pain in your abdominal area. You have persistent nausea, vomiting, or diarrhea. You have a bad smelling vaginal discharge. You have pain with urination. SEEK IMMEDIATE MEDICAL CARE IF:  You  have a fever. You are leaking fluid from your vagina. You have spotting or bleeding from your vagina. You have severe abdominal cramping or pain. You have rapid weight loss or gain. You have shortness of breath with chest pain. You notice sudden or extreme swelling of your face, hands, ankles, feet, or legs. You have not felt your baby move in over an hour. You have severe headaches that do not go away with medicine. You have vision changes. Document Released: 06/26/2001 Document Revised: 07/07/2013 Document Reviewed: 09/02/2012 [facility] Patient Information 2015 [place], [place]. This information is not intended to replace advice given to you by your health care provider. Make sure you discuss any questions you have with your health care provider.

## 2023-05-08 NOTE — Progress Notes (Signed)
[place] 30+3 wks,cephalic,cx 3.3 cm,posterior placenta gr 3,AFI 19 cm,FHR 135 bpm,EFW 1528 g 30%

## 2023-05-14 ENCOUNTER — Other Ambulatory Visit: Payer: Self-pay | Admitting: Obstetrics & Gynecology

## 2023-05-14 DIAGNOSIS — Z87898 Personal history of other specified conditions: Secondary | ICD-10-CM

## 2023-05-14 DIAGNOSIS — O10919 Unspecified pre-existing hypertension complicating pregnancy, unspecified trimester: Secondary | ICD-10-CM

## 2023-05-20 ENCOUNTER — Ambulatory Visit (INDEPENDENT_AMBULATORY_CARE_PROVIDER_SITE_OTHER): Payer: Medicaid Other | Admitting: Obstetrics & Gynecology

## 2023-05-20 ENCOUNTER — Other Ambulatory Visit: Payer: Self-pay | Admitting: Obstetrics & Gynecology

## 2023-05-20 ENCOUNTER — Encounter: Payer: Self-pay | Admitting: Obstetrics & Gynecology

## 2023-05-20 ENCOUNTER — Ambulatory Visit: Payer: Medicaid Other | Admitting: Radiology

## 2023-05-20 VITALS — BP 122/77 | HR 91 | Wt 212.0 lb

## 2023-05-20 DIAGNOSIS — Z3A32 32 weeks gestation of pregnancy: Secondary | ICD-10-CM

## 2023-05-20 DIAGNOSIS — O0993 Supervision of high risk pregnancy, unspecified, third trimester: Secondary | ICD-10-CM

## 2023-05-20 DIAGNOSIS — O10913 Unspecified pre-existing hypertension complicating pregnancy, third trimester: Secondary | ICD-10-CM

## 2023-05-20 DIAGNOSIS — O0992 Supervision of high risk pregnancy, unspecified, second trimester: Secondary | ICD-10-CM

## 2023-05-20 DIAGNOSIS — O10919 Unspecified pre-existing hypertension complicating pregnancy, unspecified trimester: Secondary | ICD-10-CM

## 2023-05-20 DIAGNOSIS — Z87898 Personal history of other specified conditions: Secondary | ICD-10-CM | POA: Diagnosis not present

## 2023-05-20 NOTE — Progress Notes (Signed)
[place] 32+1 by early ultrasound  BPP and UAD today Single active female fetus,  Cephalic   FHR = 283TDV BPP = 8/8    AFI = 17.5cm  70%    SVP = 5.3cm Umb Artery RI:  0.64   ~65% CL = 4 cm,  closed

## 2023-05-20 NOTE — Progress Notes (Signed)
[facility] PREGNANCY VISIT Patient name: Diana Schmitt MRN [id number]  Date of birth: 2000-11-03 Chief Complaint:   Routine Prenatal Visit  History of Present Illness:   Drisana Schweickert is a 22 y.o. G46P1001 female at [redacted]w[redacted]d with an Estimated Date of Delivery: 07/14/23 being seen today for ongoing management of a high-risk pregnancy complicated by cHTN on labetalol 200 mg BID with good control.    Today she reports no complaints. Contractions: Not present. Vag. Bleeding: None.  Movement: Present. denies leaking of fluid.      01/07/2023   10:24 AM 02/12/2019    9:09 AM 02/12/2019    9:07 AM  Depression screen PHQ 2/9  Decreased Interest 0 0 0  Down, Depressed, Hopeless 0 0 0  PHQ - 2 Score 0 0 0  Altered sleeping 0    Tired, decreased energy 0    Change in appetite 0    Feeling bad or failure about yourself  0    Trouble concentrating 0    Moving slowly or fidgety/restless 0    Suicidal thoughts 0    PHQ-9 Score 0          01/07/2023   10:24 AM  GAD 7 : Generalized Anxiety Score  Nervous, Anxious, on Edge 2  Control/stop worrying 2  Worry too much - different things 2  Trouble relaxing 1  Restless 0  Easily annoyed or irritable 0  Afraid - awful might happen 0  Total GAD 7 Score 7     Review of Systems:   Pertinent items are noted in HPI Denies abnormal vaginal discharge w/ itching/odor/irritation, headaches, visual changes, shortness of breath, chest pain, abdominal pain, severe nausea/vomiting, or problems with urination or bowel movements unless otherwise stated above. Pertinent History Reviewed:  Reviewed past medical,surgical, social, obstetrical and family history.  Reviewed problem list, medications and allergies. Physical Assessment:   Vitals:   05/20/23 0910  BP: 122/77  Pulse: 91  Weight: 212 lb (96.2 kg)  Body mass index is 41.4 kg/m.           Physical Examination:   General appearance: alert, well appearing, and in no distress  Mental status: alert,  oriented to person, place, and time  Skin: warm & dry   Extremities:      Cardiovascular: normal heart rate noted  Respiratory: normal respiratory effort, no distress  Abdomen: gravid, soft, non-tender  Pelvic: Cervical exam deferred         Fetal Status:     Movement: Present    Fetal Surveillance Testing today: BPP 8/8 with normal UAD   Chaperone: N/A    No results found for this or any previous visit (from the past 24 hour(s)).  Assessment & Plan:  High-risk pregnancy: G2P1001 at [redacted]w[redacted]d with an Estimated Date of Delivery: 07/14/23      ICD-10-CM   1. Supervision of high risk pregnancy in third trimester  O09.93     2. Chronic hypertension: labetalol 200 BID + ASA 162  O10.919     3. History of poor fetal growth: EFW 30%  Z87.898         Meds: No orders of the defined types were placed in this encounter.   Orders: No orders of the defined types were placed in this encounter.    Labs/procedures today: U/S  Treatment Plan:  twice weekly surveillance  Reviewed: Preterm labor symptoms and general obstetric precautions including but not limited to vaginal bleeding, contractions, leaking of fluid and  fetal movement were reviewed in detail with the patient.  All questions were answered. Does have home bp cuff. Office bp cuff given: not applicable. Check bp twice daily, let us know if consistently >150 and/or >95.  Follow-up: No follow-ups on file.   Future Appointments  Date Time Provider Department Center  05/20/2023  9:30 AM Lazaro Arms, MD [facility]  05/23/2023  9:50 AM [facility] NURSE [facility]  05/27/2023 10:30 AM [facility] 2 [facility] None  05/27/2023 11:50 AM Jacklyn Shell, CNM [facility]  05/30/2023  9:30 AM [facility] NURSE [facility]  06/03/2023  8:30 AM [facility] 2 [facility] None  06/03/2023  9:50 AM Cheral Marker, CNM [facility]  06/03/2023 10:10 AM Cheral Marker, CNM [facility]  06/06/2023  9:10 AM  [facility] NURSE [facility]  06/10/2023 10:00 AM [facility] [place] [facility] None  06/10/2023 10:50 AM Myna Hidalgo, DO [facility]  06/17/2023  8:30 AM [facility] 2 [facility] None  06/17/2023  9:30 AM Myna Hidalgo, DO [facility]  06/20/2023 10:10 AM [facility] NURSE [facility]  06/24/2023  8:30 AM [facility] IMG 2 [facility] None  06/24/2023  9:30 AM Jacklyn Shell, CNM [facility]  06/27/2023  9:50 AM [facility] NURSE [facility]  07/01/2023  8:30 AM [facility] 2 [facility] None  07/01/2023  9:30 AM Myna Hidalgo, DO [facility]  07/04/2023 10:10 AM [facility] NURSE [facility]  07/08/2023  9:10 AM [facility] NURSE [facility]  07/08/2023  9:30 AM Cheral Marker, CNM [facility]  07/11/2023 10:50 AM [facility] NURSE [facility]    No orders of the defined types were placed in this encounter.  Lazaro Arms  Attending Physician for the Center for [facility] 05/20/2023 9:29 AM

## 2023-05-23 ENCOUNTER — Ambulatory Visit: Payer: Medicaid Other

## 2023-05-23 VITALS — BP 118/74 | HR 81 | Wt 212.5 lb

## 2023-05-23 DIAGNOSIS — O10913 Unspecified pre-existing hypertension complicating pregnancy, third trimester: Secondary | ICD-10-CM | POA: Diagnosis not present

## 2023-05-23 DIAGNOSIS — Z3A32 32 weeks gestation of pregnancy: Secondary | ICD-10-CM | POA: Diagnosis not present

## 2023-05-23 DIAGNOSIS — O0993 Supervision of high risk pregnancy, unspecified, third trimester: Secondary | ICD-10-CM | POA: Diagnosis not present

## 2023-05-23 DIAGNOSIS — O10919 Unspecified pre-existing hypertension complicating pregnancy, unspecified trimester: Secondary | ICD-10-CM

## 2023-05-23 LAB — POCT URINALYSIS DIPSTICK OB
Blood, UA: NEGATIVE
Glucose, UA: NEGATIVE
Ketones, UA: NEGATIVE
Nitrite, UA: NEGATIVE
POC,PROTEIN,UA: NEGATIVE

## 2023-05-23 NOTE — Progress Notes (Addendum)
   NURSE VISIT- NST  SUBJECTIVE:  Diana Schmitt is a 22 y.o. G2P1001 female at [redacted]w[redacted]d, here for a NST for pregnancy complicated by [facility] and Morbid obesity (BMI >=40).  She reports active fetal movement, contractions: none, vaginal bleeding: none, membranes: intact.   OBJECTIVE:  BP 118/74   Pulse 81   Wt 212 lb 8 oz (96.4 kg)   LMP 09/27/2022 Comment: sometime last month  BMI 41.50 kg/m   Appears well, no apparent distress  Results for orders placed or performed in visit on 05/23/23 (from the past 24 hour(s))  POC Urinalysis Dipstick OB   Collection Time: 05/23/23  1:07 PM  Result Value Ref Range   Color, UA     Clarity, UA     Glucose, UA Negative Negative   Bilirubin, UA     Ketones, UA Negative    Spec Grav, UA     Blood, UA Negative    pH, UA     POC,PROTEIN,UA Negative Negative, Trace, Small (1+), Moderate (2+), Large (3+), 4+   Urobilinogen, UA     Nitrite, UA Negative    Leukocytes, UA Small (1+) (A) Negative   Appearance     Odor      NST: FHR baseline 125 bpm, Variability: moderate, Accelerations:present, Decelerations:  Absent= Cat 1/reactive Toco: irregular, every 6-15 minutes   ASSESSMENT: G2P1001 at [redacted]w[redacted]d with CHTN and Morbid obesity (BMI >=40) NST reactive  PLAN: EFM strip reviewed by Cathie Beams, CNM   Recommendations: keep next appointment as scheduled    Caralyn Guile  05/23/2023 1:09 PM   Chart reviewed for nurse visit. Agree with plan of care.  Jacklyn Shell, CNM 05/23/2023 1:56 PM

## 2023-05-27 ENCOUNTER — Ambulatory Visit (INDEPENDENT_AMBULATORY_CARE_PROVIDER_SITE_OTHER): Payer: Medicaid Other | Admitting: Advanced Practice Midwife

## 2023-05-27 ENCOUNTER — Other Ambulatory Visit: Payer: Self-pay | Admitting: Obstetrics & Gynecology

## 2023-05-27 ENCOUNTER — Encounter: Payer: Self-pay | Admitting: Advanced Practice Midwife

## 2023-05-27 ENCOUNTER — Ambulatory Visit: Payer: Medicaid Other | Admitting: Radiology

## 2023-05-27 VITALS — BP 126/77 | HR 97 | Wt 214.0 lb

## 2023-05-27 DIAGNOSIS — O0990 Supervision of high risk pregnancy, unspecified, unspecified trimester: Secondary | ICD-10-CM

## 2023-05-27 DIAGNOSIS — O10913 Unspecified pre-existing hypertension complicating pregnancy, third trimester: Secondary | ICD-10-CM | POA: Diagnosis not present

## 2023-05-27 DIAGNOSIS — O10919 Unspecified pre-existing hypertension complicating pregnancy, unspecified trimester: Secondary | ICD-10-CM

## 2023-05-27 DIAGNOSIS — Z3A33 33 weeks gestation of pregnancy: Secondary | ICD-10-CM | POA: Diagnosis not present

## 2023-05-27 DIAGNOSIS — O0993 Supervision of high risk pregnancy, unspecified, third trimester: Secondary | ICD-10-CM

## 2023-05-27 DIAGNOSIS — O99210 Obesity complicating pregnancy, unspecified trimester: Secondary | ICD-10-CM | POA: Diagnosis not present

## 2023-05-27 DIAGNOSIS — Z6841 Body Mass Index (BMI) 40.0 and over, adult: Secondary | ICD-10-CM

## 2023-05-27 NOTE — Progress Notes (Signed)
GA = 33+1 by U/S Single active female fetus,  Cephalic,    FHR = 140bpm Posterior placenta  gr1 AFI = 16.1 cm   61%    SVP = 6.0 cm  BPP = 8/8    RI 0.55   ~ 23%   (last measured 05/08/23 EFW 30%) Normal ovaries bilaterally  -  neg adnexal regions CL = 4.7 cm,   closed

## 2023-05-27 NOTE — Progress Notes (Signed)
HIGH-RISK PREGNANCY VISIT Patient name: Diana Schmitt MRN [id number]  Date of birth: July 10, 2001 Chief Complaint:   Routine Prenatal Visit (Pelvic pain)  History of Present Illness:   Diana Schmitt is a 22 y.o. G46P1001 female at [redacted]w[redacted]d with an Estimated Date of Delivery: 07/14/23 being seen today for ongoing management of a high-risk pregnancy complicated by chronic hypertension currently on labetalol 200mg  BID.    Today she reports no complaints. Contractions: Irritability. Vag. Bleeding: None.  Movement: Present. denies leaking of fluid.      01/07/2023   10:24 AM 02/12/2019    9:09 AM 02/12/2019    9:07 AM  Depression screen PHQ 2/9  Decreased Interest 0 0 0  Down, Depressed, Hopeless 0 0 0  PHQ - 2 Score 0 0 0  Altered sleeping 0    Tired, decreased energy 0    Change in appetite 0    Feeling bad or failure about yourself  0    Trouble concentrating 0    Moving slowly or fidgety/restless 0    Suicidal thoughts 0    PHQ-9 Score 0          01/07/2023   10:24 AM  GAD 7 : Generalized Anxiety Score  Nervous, Anxious, on Edge 2  Control/stop worrying 2  Worry too much - different things 2  Trouble relaxing 1  Restless 0  Easily annoyed or irritable 0  Afraid - awful might happen 0  Total GAD 7 Score 7     Review of Systems:   Pertinent items are noted in HPI Denies abnormal vaginal discharge w/ itching/odor/irritation, headaches, visual changes, shortness of breath, chest pain, abdominal pain, severe nausea/vomiting, or problems with urination or bowel movements unless otherwise stated above. Pertinent History Reviewed:  Reviewed past medical,surgical, social, obstetrical and family history.  Reviewed problem list, medications and allergies. Physical Assessment:   Vitals:   05/27/23 1031  BP: 126/77  Pulse: 97  Weight: 214 lb (97.1 kg)  Body mass index is 41.79 kg/m.           Physical Examination:   General appearance: alert, well appearing, and in no  distress  Mental status: alert, oriented to person, place, and time  Skin: warm & dry   Extremities: Edema: None    Cardiovascular: normal heart rate noted  Respiratory: normal respiratory effort, no distress  Abdomen: gravid, soft, non-tender  Pelvic: Cervical exam deferred         Chaperone: N/A    Fetal Status:     Movement: Present    Fetal Surveillance Testing today:   GA = 33+1 by U/S Single active female fetus,  Cephalic,    FHR = 140bpm Posterior placenta  gr1 AFI = 16.1 cm   61%    SVP = 6.0 cm  BPP = 8/8    RI 0.55   ~ 23%   (last measured 05/08/23 EFW 30%) Normal ovaries bilaterally  -  neg adnexal regions CL = 4.7 cm,   closed       No results found for this or any previous visit (from the past 24 hour(s)).  Assessment & Plan:  High-risk pregnancy: G2P1001 at [redacted]w[redacted]d with an Estimated Date of Delivery: 07/14/23   1. [redacted] weeks gestation of pregnancy   2. Chronic hypertension affecting pregnancy Continue twice weekly testing  3. Supervision of high risk pregnancy in third trimester   4. Morbid obesity with BMI of 40.0-44.9, adult (HCC)     Meds: No  orders of the defined types were placed in this encounter.   Orders: No orders of the defined types were placed in this encounter.    Labs/procedures today: U/S   Reviewed: Preterm labor symptoms and general obstetric precautions including but not limited to vaginal bleeding, contractions, leaking of fluid and fetal movement were reviewed in detail with the patient.  All questions were answered. Does have home bp cuff. Office bp cuff given: not applicable. Check bp daily, let us know if consistently >140 and/or >90.  Follow-up: No follow-ups on file.   Future Appointments  Date Time Provider Department Center  05/27/2023 10:50 AM Jacklyn Shell, CNM [facility]  05/30/2023  9:30 AM [facility] NURSE [facility]  06/03/2023  8:30 AM [facility] 2 [facility] None  06/03/2023  9:50 AM  Cheral Marker, CNM [facility]  06/03/2023 10:10 AM Cheral Marker, CNM [facility]  06/06/2023  9:10 AM [facility] NURSE [facility]  06/10/2023 10:00 AM [facility] [place] [facility] None  06/10/2023 10:50 AM Myna Hidalgo, DO [facility]  06/17/2023  8:30 AM [facility] 2 [facility] None  06/17/2023  9:30 AM Myna Hidalgo, DO [facility]  06/20/2023 10:10 AM [facility] NURSE [facility]  06/24/2023  8:30 AM [facility] 2 [facility] None  06/24/2023  9:30 AM Jacklyn Shell, CNM [facility]  06/27/2023  9:50 AM [facility] NURSE [facility]  07/01/2023  8:30 AM [facility] 2 [facility] None  07/01/2023  9:30 AM Myna Hidalgo, DO [facility]  07/04/2023 10:10 AM [facility] NURSE [facility]  07/08/2023  9:10 AM Cheral Marker, CNM [facility]  07/08/2023  9:10 AM [facility] NURSE [facility]  07/11/2023 10:50 AM [facility] NURSE [facility]    No orders of the defined types were placed in this encounter.  Jacklyn Shell , DNP, CNM [facility] 05/27/2023 10:43 AM

## 2023-05-30 ENCOUNTER — Ambulatory Visit: Payer: Medicaid Other | Admitting: *Deleted

## 2023-05-30 VITALS — BP 136/77 | HR 103

## 2023-05-30 DIAGNOSIS — Z3A33 33 weeks gestation of pregnancy: Secondary | ICD-10-CM

## 2023-05-30 DIAGNOSIS — O10919 Unspecified pre-existing hypertension complicating pregnancy, unspecified trimester: Secondary | ICD-10-CM

## 2023-05-30 DIAGNOSIS — O10913 Unspecified pre-existing hypertension complicating pregnancy, third trimester: Secondary | ICD-10-CM | POA: Diagnosis not present

## 2023-05-30 DIAGNOSIS — O0993 Supervision of high risk pregnancy, unspecified, third trimester: Secondary | ICD-10-CM | POA: Diagnosis not present

## 2023-05-30 NOTE — Progress Notes (Signed)
   NURSE VISIT- NST  SUBJECTIVE:  Diana Schmitt is a 22 y.o. G2P1001 female at [redacted]w[redacted]d, here for a NST for pregnancy complicated by [facility].  She reports active fetal movement, contractions: none, vaginal bleeding: none, membranes: intact.   OBJECTIVE:  BP 136/77   Pulse (!) 103   LMP 09/27/2022 Comment: sometime last month  Appears well, no apparent distress  No results found for this or any previous visit (from the past 24 hour(s)).  NST: FHR baseline 125 bpm, Variability: moderate, Accelerations:present, Decelerations:  Absent= Cat 1/reactive Toco: occasional   ASSESSMENT: G2P1001 at [redacted]w[redacted]d with CHTN NST reactive  PLAN: EFM strip reviewed by Dr. Charlotta Newton   Recommendations: keep next appointment as scheduled    Jobe Marker  05/30/2023 10:27 AM

## 2023-06-03 ENCOUNTER — Ambulatory Visit (INDEPENDENT_AMBULATORY_CARE_PROVIDER_SITE_OTHER): Payer: Medicaid Other | Admitting: Women's Health

## 2023-06-03 ENCOUNTER — Encounter: Payer: Medicaid Other | Admitting: Women's Health

## 2023-06-03 ENCOUNTER — Encounter: Payer: Self-pay | Admitting: Women's Health

## 2023-06-03 ENCOUNTER — Other Ambulatory Visit (HOSPITAL_COMMUNITY)
Admission: RE | Admit: 2023-06-03 | Discharge: 2023-06-03 | Disposition: A | Discharge status: Home / Self Care | Payer: Medicaid Other | Source: Ambulatory Visit | Attending: Women's Health | Admitting: Women's Health

## 2023-06-03 ENCOUNTER — Ambulatory Visit: Payer: Medicaid Other | Admitting: Radiology

## 2023-06-03 VITALS — BP 134/74 | HR 105 | Wt 213.4 lb

## 2023-06-03 DIAGNOSIS — O99213 Obesity complicating pregnancy, third trimester: Secondary | ICD-10-CM | POA: Diagnosis not present

## 2023-06-03 DIAGNOSIS — O0993 Supervision of high risk pregnancy, unspecified, third trimester: Secondary | ICD-10-CM | POA: Diagnosis not present

## 2023-06-03 DIAGNOSIS — Z3A34 34 weeks gestation of pregnancy: Secondary | ICD-10-CM

## 2023-06-03 DIAGNOSIS — L292 Pruritus vulvae: Secondary | ICD-10-CM | POA: Insufficient documentation

## 2023-06-03 DIAGNOSIS — O10913 Unspecified pre-existing hypertension complicating pregnancy, third trimester: Secondary | ICD-10-CM | POA: Diagnosis not present

## 2023-06-03 DIAGNOSIS — O10919 Unspecified pre-existing hypertension complicating pregnancy, unspecified trimester: Secondary | ICD-10-CM

## 2023-06-03 DIAGNOSIS — Z6841 Body Mass Index (BMI) 40.0 and over, adult: Secondary | ICD-10-CM

## 2023-06-03 DIAGNOSIS — O0990 Supervision of high risk pregnancy, unspecified, unspecified trimester: Secondary | ICD-10-CM

## 2023-06-03 LAB — POCT URINALYSIS DIPSTICK OB
Blood, UA: NEGATIVE
Glucose, UA: NEGATIVE
Nitrite, UA: NEGATIVE
POC,PROTEIN,UA: NEGATIVE

## 2023-06-03 NOTE — Progress Notes (Signed)
GA 34+1 by early U/S Single active female fetus,  Cephalic,   FHR = 127bpm Posterior placenta  gr1  AFI = 15.8 cm  60%   SVP= 5.8 cm EFW 16%  AC 6.8%  No apparent abn BPP = 8/8          RI 0.65,  0.67  78% Normal ov's - neg CDS - no free fluid CL = 5.2cm,   closed

## 2023-06-03 NOTE — Progress Notes (Signed)
HIGH-RISK PREGNANCY VISIT Patient name: Diana Schmitt MRN [id number]  Date of birth: 2001-04-06 Chief Complaint:   High Risk Gestation ([place] today!!)  History of Present Illness:   Diana Schmitt is a 22 y.o. G53P1001 female at [redacted]w[redacted]d with an Estimated Date of Delivery: 07/14/23 being seen today for ongoing management of a high-risk pregnancy complicated by chronic hypertension currently on labetalol 200mg  BID, PG BMI >=40.    Today she reports  vulvar itching, some clumpy d/c, thinks has yeast infection . Also pelvic pressure.  Contractions: Irritability. Vag. Bleeding: None.  Movement: Present. denies leaking of fluid.      01/07/2023   10:24 AM 02/12/2019    9:09 AM 02/12/2019    9:07 AM  Depression screen PHQ 2/9  Decreased Interest 0 0 0  Down, Depressed, Hopeless 0 0 0  PHQ - 2 Score 0 0 0  Altered sleeping 0    Tired, decreased energy 0    Change in appetite 0    Feeling bad or failure about yourself  0    Trouble concentrating 0    Moving slowly or fidgety/restless 0    Suicidal thoughts 0    PHQ-9 Score 0          01/07/2023   10:24 AM  GAD 7 : Generalized Anxiety Score  Nervous, Anxious, on Edge 2  Control/stop worrying 2  Worry too much - different things 2  Trouble relaxing 1  Restless 0  Easily annoyed or irritable 0  Afraid - awful might happen 0  Total GAD 7 Score 7     Review of Systems:   Pertinent items are noted in HPI Denies abnormal vaginal discharge w/ itching/odor/irritation, headaches, visual changes, shortness of breath, chest pain, abdominal pain, severe nausea/vomiting, or problems with urination or bowel movements unless otherwise stated above. Pertinent History Reviewed:  Reviewed past medical,surgical, social, obstetrical and family history.  Reviewed problem list, medications and allergies. Physical Assessment:   Vitals:   06/03/23 0919  BP: 134/74  Pulse: (!) 105  Weight: 213 lb 6.4 oz (96.8 kg)  Body mass index is 41.68 kg/m.            Physical Examination:   General appearance: alert, well appearing, and in no distress  Mental status: alert, oriented to person, place, and time  Skin: warm & dry   Extremities: Edema: None    Cardiovascular: normal heart rate noted  Respiratory: normal respiratory effort, no distress  Abdomen: gravid, soft, non-tender  Pelvic:  self-collected CV swab          Fetal Status:     Movement: Present    Fetal Surveillance Testing today:  GA 34+1 by early U/S Single active female fetus,  Cephalic,   FHR = 127bpm Posterior placenta  gr1  AFI = 15.8 cm  60%   SVP= 5.8 cm EFW 16%  AC 6.8%  No apparent abn BPP = 8/8          RI 0.65,  0.67  78% Normal ov's - neg CDS - no free fluid CL = 5.2cm,   closed  Chaperone: N/A    Results for orders placed or performed in visit on 06/03/23 (from the past 24 hour(s))  POC Urinalysis Dipstick OB   Collection Time: 06/03/23  9:23 AM  Result Value Ref Range   Color, UA     Clarity, UA     Glucose, UA Negative Negative   Bilirubin, UA     Ketones,  UA trace    Spec Grav, UA     Blood, UA neg    pH, UA     POC,PROTEIN,UA Negative Negative, Trace, Small (1+), Moderate (2+), Large (3+), 4+   Urobilinogen, UA     Nitrite, UA neg    Leukocytes, UA Small (1+) (A) Negative   Appearance     Odor      Assessment & Plan:  High-risk pregnancy: G2P1001 at [redacted]w[redacted]d with an Estimated Date of Delivery: 07/14/23   1) CHTN, stable on labetalol 200mg  BID, ASA, reviewed pre-e s/s, reasons to seek care, check bp daily- if >140/90 let us know  2) PG BMI >=40  3) Vulvar itching> self collected CV swab  4) Pelvic pressure> discussed tape/belt  Meds: No orders of the defined types were placed in this encounter.   Labs/procedures today: CV swab and U/S  Treatment Plan:  [place] q4wks    2x/wk testing nst/sono      Deliver 37-39wks (36-37wks or earlier if poor control)____   Reviewed: Preterm labor symptoms and general obstetric precautions including but not limited  to vaginal bleeding, contractions, leaking of fluid and fetal movement were reviewed in detail with the patient.  All questions were answered. Does have home bp cuff. Office bp cuff given: not applicable. Check bp daily, let us know if consistently >140 and/or >90.  Follow-up: Return for As scheduled.   Future Appointments  Date Time Provider Department Center  06/03/2023  9:50 AM Cheral Marker, CNM [facility]  06/06/2023  9:10 AM [facility] NURSE [facility]  06/10/2023 10:00 AM [facility] [place] [facility] None  06/10/2023 10:50 AM Myna Hidalgo, DO [facility]  06/17/2023  8:30 AM [facility] 2 [facility] None  06/17/2023  9:30 AM Myna Hidalgo, DO [facility]  06/20/2023 10:10 AM [facility] NURSE [facility]  06/24/2023  8:30 AM [facility] 2 [facility] None  06/24/2023  9:30 AM Jacklyn Shell, CNM [facility]  06/27/2023  9:50 AM [facility] NURSE [facility]  07/01/2023  8:30 AM [facility] 2 [facility] None  07/01/2023  9:30 AM Myna Hidalgo, DO [facility]  07/04/2023 10:10 AM [facility] NURSE [facility]  07/08/2023  9:10 AM Cheral Marker, CNM [facility]  07/08/2023  9:10 AM [facility] NURSE [facility]  07/11/2023 10:50 AM [facility] NURSE [facility]    Orders Placed This Encounter  Procedures   POC Urinalysis Dipstick OB   Cheral Marker CNM, [facility] 06/03/2023 9:48 AM

## 2023-06-03 NOTE — Patient Instructions (Signed)
Josceline, thank you for choosing our office today! We appreciate the opportunity to meet your healthcare needs. You may receive a short survey by mail, e-mail, or through [employer]. If you are happy with your care we would appreciate if you could take just a few minutes to complete the survey questions. We read all of your comments and take your feedback very seriously. Thank you again for choosing our office.  Center for [employer] Team at [facility]  [facility] at [facility] ([address] [place], [place] [address]) Entrance C, located off of [address] Free 24/7 valet parking   CLASSES: Go to [email or web address] to register for classes (childbirth, breastfeeding, waterbirth, infant CPR, daddy bootcamp, etc.)  Call the office [phone]) or go to [facility] if: You begin to have strong, frequent contractions Your water breaks.  Sometimes it is a big gush of fluid, sometimes it is just a trickle that keeps getting your panties wet or running down your legs You have vaginal bleeding.  It is normal to have a small amount of spotting if your cervix was checked.  You don't feel your baby moving like normal.  If you don't, get you something to eat and drink and lay down and focus on feeling your baby move.   If your baby is still not moving like normal, you should call the office or go to [facility].  Call the office [phone]) or go to [facility] hospital for these signs of pre-eclampsia: Severe headache that does not go away with Tylenol Visual changes- seeing spots, double, blurred vision Pain under your right breast or upper abdomen that does not go away with Tums or heartburn medicine Nausea and/or vomiting Severe swelling in your hands, feet, and face   Tdap Vaccine It is recommended that you get the Tdap vaccine during the third trimester of EACH pregnancy to help protect your baby from getting pertussis (whooping cough) 27-36 weeks is the BEST time to do this  so that you can pass the protection on to your baby. During pregnancy is better than after pregnancy, but if you are unable to get it during pregnancy it will be offered at the hospital.  You can get this vaccine with [place], at the health department, your family doctor, or some local pharmacies Everyone who will be around your baby should also be up-to-date on their vaccines before the baby comes. Adults (who are not pregnant) only need 1 dose of Tdap during adulthood.   [facility] Pediatricians/Family Doctors [facility]): [address] Colette Ribas, [phone]           [facility]: [address], [phone]                [facility]): [address], [phone] (call to ask if accepting patients) [facility] Department: [address], [place], [phone]    [facility] Pediatricians/Family Doctors [facility]): [phone] [address], [phone] [facility]: [address] [place], [phone] [facility]: [address], [phone]  [facility] Doctors  [facility]): [phone] [facility]: [address], [phone]   [facility] Doctors [facility]: [address] [phone]  [facility] Family Doctors  [facility]: [phone], [phone]  Home Blood Pressure Monitoring for Patients   Your provider has recommended that you check your  blood pressure (BP) at least once a week at home. If you do not have a blood pressure cuff at home, one will be provided for you. Contact your provider if you have not received your monitor within 1 week.   Helpful Tips for Accurate Home Blood Pressure Checks  Don't smoke, exercise, or drink caffeine 30 minutes before checking your BP Use the restroom before checking your BP (a full bladder can raise your  pressure) Relax in a comfortable upright chair Feet on the ground Left arm resting comfortably on a flat surface at the level of your heart Legs uncrossed Back supported Sit quietly and don't talk Place the cuff on your bare arm Adjust snuggly, so that only two fingertips can fit between your skin and the top of the cuff Check 2 readings separated by at least one minute Keep a log of your BP readings For a visual, please reference this diagram: http://ccnc.care/bpdiagram  Provider Name: [facility]     Phone: [phone]  Zone 1: ALL CLEAR  Continue to monitor your symptoms:  BP reading is less than 140 (top number) or less than 90 (bottom number)  No right upper stomach pain No headaches or seeing spots No feeling nauseated or throwing up No swelling in face and hands  Zone 2: CAUTION Call your doctor's office for any of the following:  BP reading is greater than 140 (top number) or greater than 90 (bottom number)  Stomach pain under your ribs in the middle or right side Headaches or seeing spots Feeling nauseated or throwing up Swelling in face and hands  Zone 3: EMERGENCY  Seek immediate medical care if you have any of the following:  BP reading is greater than160 (top number) or greater than 110 (bottom number) Severe headaches not improving with Tylenol Serious difficulty catching your breath Any worsening symptoms from Zone 2  Preterm Labor and Birth Information  The normal length of a pregnancy is 39-41 weeks. Preterm labor is when labor starts before 37 completed weeks of pregnancy. What are the risk factors for preterm labor? Preterm labor is more likely to occur in women who: Have certain infections during pregnancy such as a bladder infection, sexually transmitted infection, or infection inside the uterus (chorioamnionitis). Have a shorter-than-normal cervix. Have gone into preterm labor before. Have had surgery on their cervix. Are younger than age 29  or older than age 54. Are African American. Are pregnant with twins or multiple babies (multiple gestation). Take street drugs or smoke while pregnant. Do not gain enough weight while pregnant. Became pregnant shortly after having been pregnant. What are the symptoms of preterm labor? Symptoms of preterm labor include: Cramps similar to those that can happen during a menstrual period. The cramps may happen with diarrhea. Pain in the abdomen or lower back. Regular uterine contractions that may feel like tightening of the abdomen. A feeling of increased pressure in the pelvis. Increased watery or bloody mucus discharge from the vagina. Water breaking (ruptured amniotic sac). Why is it important to recognize signs of preterm labor? It is important to recognize signs of preterm labor because babies who are born prematurely may not be fully developed. This can put them at an increased risk for: Long-term (chronic) heart and lung problems. Difficulty immediately after birth with regulating body systems, including blood sugar, body temperature, heart rate, and breathing rate. Bleeding in the brain. Cerebral palsy. Learning difficulties. Death. These risks are highest for babies who are born before 34 weeks  of pregnancy. How is preterm labor treated? Treatment depends on the length of your pregnancy, your condition, and the health of your baby. It may involve: Having a stitch (suture) placed in your cervix to prevent your cervix from opening too early (cerclage). Taking or being given medicines, such as: Hormone medicines. These may be given early in pregnancy to help support the pregnancy. Medicine to stop contractions. Medicines to help mature the baby's lungs. These may be prescribed if the risk of delivery is high. Medicines to prevent your baby from developing cerebral palsy. If the labor happens before 34 weeks of pregnancy, you may need to stay in the hospital. What should I do if I  think I am in preterm labor? If you think that you are going into preterm labor, call your health care provider right away. How can I prevent preterm labor in future pregnancies? To increase your chance of having a full-term pregnancy: Do not use any tobacco products, such as cigarettes, chewing tobacco, and e-cigarettes. If you need help quitting, ask your health care provider. Do not use street drugs or medicines that have not been prescribed to you during your pregnancy. Talk with your health care provider before taking any herbal supplements, even if you have been taking them regularly. Make sure you gain a healthy amount of weight during your pregnancy. Watch for infection. If you think that you might have an infection, get it checked right away. Make sure to tell your health care provider if you have gone into preterm labor before. This information is not intended to replace advice given to you by your health care provider. Make sure you discuss any questions you have with your health care provider. Document Revised: 10/24/2018 Document Reviewed: 11/23/2015 Elsevier Patient Education  2020 [employer].

## 2023-06-04 ENCOUNTER — Other Ambulatory Visit: Payer: Self-pay | Admitting: Women's Health

## 2023-06-04 LAB — CERVICOVAGINAL ANCILLARY ONLY
Bacterial Vaginitis (gardnerella): NEGATIVE
Candida Glabrata: NEGATIVE
Candida Vaginitis: POSITIVE — AB
Chlamydia: NEGATIVE
Comment: NEGATIVE
Comment: NEGATIVE
Comment: NEGATIVE
Comment: NEGATIVE
Comment: NEGATIVE
Comment: NORMAL
Neisseria Gonorrhea: NEGATIVE
Trichomonas: NEGATIVE

## 2023-06-04 MED ORDER — MICONAZOLE NITRATE 2 % VA CREA: 1.0000 | TOPICAL_CREAM | Freq: Every day | VAGINAL | 0 refills | Status: DC

## 2023-06-04 MED ORDER — TERCONAZOLE 0.4 % VA CREA: 1.0000 | TOPICAL_CREAM | Freq: Every day | VAGINAL | 0 refills | Status: DC

## 2023-06-04 NOTE — Addendum Note (Signed)
Addended by: Shawna Clamp R on: 06/04/2023 02:19 PM   Modules accepted: Orders

## 2023-06-05 DIAGNOSIS — H6121 Impacted cerumen, right ear: Secondary | ICD-10-CM | POA: Diagnosis not present

## 2023-06-05 DIAGNOSIS — O99713 Diseases of the skin and subcutaneous tissue complicating pregnancy, third trimester: Secondary | ICD-10-CM | POA: Diagnosis not present

## 2023-06-05 DIAGNOSIS — Z3A34 34 weeks gestation of pregnancy: Secondary | ICD-10-CM | POA: Diagnosis not present

## 2023-06-06 ENCOUNTER — Ambulatory Visit: Payer: Medicaid Other | Admitting: *Deleted

## 2023-06-06 VITALS — BP 120/72 | HR 81 | Wt 214.0 lb

## 2023-06-06 DIAGNOSIS — O0993 Supervision of high risk pregnancy, unspecified, third trimester: Secondary | ICD-10-CM | POA: Diagnosis not present

## 2023-06-06 DIAGNOSIS — Z3A34 34 weeks gestation of pregnancy: Secondary | ICD-10-CM | POA: Diagnosis not present

## 2023-06-06 DIAGNOSIS — O10919 Unspecified pre-existing hypertension complicating pregnancy, unspecified trimester: Secondary | ICD-10-CM

## 2023-06-06 NOTE — Progress Notes (Signed)
   NURSE VISIT- NST  SUBJECTIVE:  Diana Schmitt is a 22 y.o. G2P1001 female at [redacted]w[redacted]d, here for a NST for pregnancy complicated by [facility] and morbid obesity.  She reports active fetal movement, contractions: occasional, vaginal bleeding: none, membranes: intact.   OBJECTIVE:  BP 120/72   Pulse 81   Wt 214 lb (97.1 kg)   LMP 09/27/2022 Comment: sometime last month  BMI 41.79 kg/m   Appears well, no apparent distress  No results found for this or any previous visit (from the past 24 hour(s)).  NST: FHR baseline 125 bpm, Variability: moderate, Accelerations:present, Decelerations:  Absent= Cat 1/reactive Toco: none   ASSESSMENT: G2P1001 at [redacted]w[redacted]d with CHTN and morbid obesity NST reactive  PLAN: EFM strip reviewed by Dr. Despina Hidden   Recommendations: keep next appointment as scheduled    Jobe Marker  06/06/2023 10:06 AM

## 2023-06-07 ENCOUNTER — Other Ambulatory Visit: Payer: Self-pay | Admitting: Obstetrics & Gynecology

## 2023-06-07 DIAGNOSIS — O10919 Unspecified pre-existing hypertension complicating pregnancy, unspecified trimester: Secondary | ICD-10-CM

## 2023-06-07 DIAGNOSIS — O0990 Supervision of high risk pregnancy, unspecified, unspecified trimester: Secondary | ICD-10-CM

## 2023-06-10 ENCOUNTER — Ambulatory Visit (INDEPENDENT_AMBULATORY_CARE_PROVIDER_SITE_OTHER): Payer: Medicaid Other | Admitting: Obstetrics & Gynecology

## 2023-06-10 ENCOUNTER — Ambulatory Visit: Payer: Medicaid Other

## 2023-06-10 ENCOUNTER — Encounter: Payer: Self-pay | Admitting: Obstetrics & Gynecology

## 2023-06-10 VITALS — BP 134/82 | HR 94 | Wt 212.0 lb

## 2023-06-10 DIAGNOSIS — O99213 Obesity complicating pregnancy, third trimester: Secondary | ICD-10-CM

## 2023-06-10 DIAGNOSIS — Z3A35 35 weeks gestation of pregnancy: Secondary | ICD-10-CM

## 2023-06-10 DIAGNOSIS — O0990 Supervision of high risk pregnancy, unspecified, unspecified trimester: Secondary | ICD-10-CM

## 2023-06-10 DIAGNOSIS — O0993 Supervision of high risk pregnancy, unspecified, third trimester: Secondary | ICD-10-CM

## 2023-06-10 DIAGNOSIS — O10913 Unspecified pre-existing hypertension complicating pregnancy, third trimester: Secondary | ICD-10-CM | POA: Diagnosis not present

## 2023-06-10 DIAGNOSIS — O10919 Unspecified pre-existing hypertension complicating pregnancy, unspecified trimester: Secondary | ICD-10-CM

## 2023-06-10 MED ORDER — LABETALOL HCL 200 MG PO TABS: 200.0000 mg | ORAL_TABLET | Freq: Two times a day (BID) | ORAL | 3 refills | Status: DC

## 2023-06-10 NOTE — Progress Notes (Signed)
HIGH-RISK PREGNANCY VISIT Patient name: Diana Schmitt MRN [id number]  Date of birth: April 05, 2001 Chief Complaint:   Routine Prenatal Visit  History of Present Illness:   Diana Schmitt is a 22 y.o. G74P1001 female at [redacted]w[redacted]d with an Estimated Date of Delivery: 07/14/23 being seen today for ongoing management of a high-risk pregnancy complicated by:  -Chronic HTN- on Labetalol 200mg  bid  Today she reports no complaints.   Contractions: Not present. Vag. Bleeding: None.  Movement: Present. denies leaking of fluid.      01/07/2023   10:24 AM 02/12/2019    9:09 AM 02/12/2019    9:07 AM  Depression screen PHQ 2/9  Decreased Interest 0 0 0  Down, Depressed, Hopeless 0 0 0  PHQ - 2 Score 0 0 0  Altered sleeping 0    Tired, decreased energy 0    Change in appetite 0    Feeling bad or failure about yourself  0    Trouble concentrating 0    Moving slowly or fidgety/restless 0    Suicidal thoughts 0    PHQ-9 Score 0       Current Outpatient Medications  Medication Instructions   aspirin EC 162 mg, Oral, Daily, Swallow whole.   Blood Pressure Monitor MISC For regular home bp monitoring during pregnancy   famotidine (PEPCID) 20 mg, Oral, Daily   labetalol (NORMODYNE) 200 mg, Oral, 2 times daily   omeprazole (PRILOSEC) 20 mg, Oral, Daily, 1 tablet a day   Prenatal Vit-Fe Fumarate-FA (PREPLUS) 27-1 MG TABS 1 tablet, Oral, Daily     Review of Systems:   Pertinent items are noted in HPI Denies abnormal vaginal discharge w/ itching/odor/irritation, headaches, visual changes, shortness of breath, chest pain, abdominal pain, severe nausea/vomiting, or problems with urination or bowel movements unless otherwise stated above. Pertinent History Reviewed:  Reviewed past medical,surgical, social, obstetrical and family history.  Reviewed problem list, medications and allergies. Physical Assessment:   Vitals:   06/10/23 1041  BP: 134/82  Pulse: 94  Weight: 212 lb (96.2 kg)  Body mass index is  41.4 kg/m.           Physical Examination:   General appearance: alert, well appearing, and in no distress  Mental status: normal mood, behavior, speech, dress, motor activity, and thought processes  Skin: warm & dry   Extremities:      Cardiovascular: normal heart rate noted  Respiratory: normal respiratory effort, no distress  Abdomen: gravid, soft, non-tender  Pelvic: Cervical exam deferred         Fetal Status:     Movement: Present    Fetal Surveillance Testing today: cephalic,posterior placenta gr 2,FHR 123 bpm,RI .65,.62,.67=81%,AFI 18.5 cm,BPP 8/8    Chaperone: N/A    No results found for this or any previous visit (from the past 24 hour(s)).   Assessment & Plan:  High-risk pregnancy: G2P1001 at [redacted]w[redacted]d with an Estimated Date of Delivery: 07/14/23   1) Chronic HTN -on Labetalol 200mg  bid, remains asymptomatic BPP 8/8, normal dopplers, continue antepartum testing -reviewed IOL 37-39wks  Continue routine OB care  Meds:  Meds ordered this encounter  Medications   labetalol (NORMODYNE) 200 MG tablet    Sig: Take 1 tablet (200 mg total) by mouth 2 (two) times daily.    Dispense:  60 tablet    Refill:  3    Labs/procedures today: BPP  Treatment Plan:  routine OB care and as outlined above  Reviewed: Preterm labor symptoms and general obstetric precautions  including but not limited to vaginal bleeding, contractions, leaking of fluid and fetal movement were reviewed in detail with the patient.  All questions were answered. Pt has home bp cuff. Check bp weekly, let us know if >140/90.   Follow-up: Return for HROB visit and twice weekly testing as scheduled.   Future Appointments  Date Time Provider Department Center  06/10/2023 10:50 AM Myna Hidalgo, DO [facility]  06/17/2023  8:30 AM [facility] 2 [facility] None  06/17/2023  9:30 AM Myna Hidalgo, DO [facility]  06/20/2023 10:10 AM [facility] NURSE [facility]  06/24/2023  8:30 AM [facility] 2  [facility] None  06/24/2023  9:30 AM Jacklyn Shell, CNM [facility]  06/27/2023  9:50 AM [facility] NURSE [facility]  07/01/2023  8:30 AM [facility] 2 [facility] None  07/01/2023  9:30 AM Myna Hidalgo, DO [facility]  07/04/2023 10:10 AM [facility] NURSE [facility]  07/08/2023  9:10 AM Cheral Marker, CNM [facility]  07/08/2023  9:10 AM [facility] NURSE [facility]  07/11/2023 10:50 AM [facility] NURSE [facility]    No orders of the defined types were placed in this encounter.   Myna Hidalgo, DO Attending Obstetrician & Gynecologist, [facility] for [employer], [facility]

## 2023-06-10 NOTE — Progress Notes (Signed)
[place] 35+1 wks,cephalic,posterior placenta gr 2,FHR 123 bpm,RI .65,.62,.67=81%,AFI 18.5 cm,BPP 8/8

## 2023-06-11 ENCOUNTER — Other Ambulatory Visit: Payer: Self-pay | Admitting: Obstetrics & Gynecology

## 2023-06-11 DIAGNOSIS — O365990 Maternal care for other known or suspected poor fetal growth, unspecified trimester, not applicable or unspecified: Secondary | ICD-10-CM

## 2023-06-11 DIAGNOSIS — O0990 Supervision of high risk pregnancy, unspecified, unspecified trimester: Secondary | ICD-10-CM

## 2023-06-11 DIAGNOSIS — O10919 Unspecified pre-existing hypertension complicating pregnancy, unspecified trimester: Secondary | ICD-10-CM

## 2023-06-17 ENCOUNTER — Ambulatory Visit (INDEPENDENT_AMBULATORY_CARE_PROVIDER_SITE_OTHER): Payer: Medicaid Other | Admitting: Obstetrics & Gynecology

## 2023-06-17 ENCOUNTER — Encounter: Payer: Self-pay | Admitting: Obstetrics & Gynecology

## 2023-06-17 ENCOUNTER — Other Ambulatory Visit (HOSPITAL_COMMUNITY)
Admission: RE | Admit: 2023-06-17 | Discharge: 2023-06-17 | Disposition: A | Discharge status: Home / Self Care | Payer: Medicaid Other | Source: Ambulatory Visit | Attending: Obstetrics & Gynecology | Admitting: Obstetrics & Gynecology

## 2023-06-17 ENCOUNTER — Other Ambulatory Visit: Payer: Self-pay | Admitting: Obstetrics & Gynecology

## 2023-06-17 ENCOUNTER — Ambulatory Visit: Payer: Medicaid Other | Admitting: Radiology

## 2023-06-17 VITALS — BP 129/75 | HR 86 | Wt 211.0 lb

## 2023-06-17 DIAGNOSIS — O99213 Obesity complicating pregnancy, third trimester: Secondary | ICD-10-CM

## 2023-06-17 DIAGNOSIS — Z3A36 36 weeks gestation of pregnancy: Secondary | ICD-10-CM

## 2023-06-17 DIAGNOSIS — Z3483 Encounter for supervision of other normal pregnancy, third trimester: Secondary | ICD-10-CM | POA: Insufficient documentation

## 2023-06-17 DIAGNOSIS — O10919 Unspecified pre-existing hypertension complicating pregnancy, unspecified trimester: Secondary | ICD-10-CM

## 2023-06-17 DIAGNOSIS — O365930 Maternal care for other known or suspected poor fetal growth, third trimester, not applicable or unspecified: Secondary | ICD-10-CM

## 2023-06-17 DIAGNOSIS — O0993 Supervision of high risk pregnancy, unspecified, third trimester: Secondary | ICD-10-CM

## 2023-06-17 DIAGNOSIS — O365990 Maternal care for other known or suspected poor fetal growth, unspecified trimester, not applicable or unspecified: Secondary | ICD-10-CM

## 2023-06-17 DIAGNOSIS — O10913 Unspecified pre-existing hypertension complicating pregnancy, third trimester: Secondary | ICD-10-CM | POA: Diagnosis not present

## 2023-06-17 DIAGNOSIS — O0990 Supervision of high risk pregnancy, unspecified, unspecified trimester: Secondary | ICD-10-CM

## 2023-06-17 NOTE — Progress Notes (Signed)
GA 36+1 Single active fetus,  cephalic  FHR = 124 bpm   posterior placenta gr1    AFI = 15.1cm  58%  SVP = 5.5cm BPP = 8/8      Umb Artery dopplers RI 0.65 75%   no apparent abn Cx long and closed

## 2023-06-17 NOTE — Progress Notes (Signed)
HIGH-RISK PREGNANCY VISIT Patient name: Diana Schmitt MRN [id number]  Date of birth: Aug 29, 2000 Chief Complaint:   Routine Prenatal Visit  History of Present Illness:   Diana Schmitt is a 22 y.o. G76P1001 female at [redacted]w[redacted]d with an Estimated Date of Delivery: 07/14/23 being seen today for ongoing management of a high-risk pregnancy complicated by:  -Chronic HTN -FGR -Obesity  Today she reports no complaints.   Contractions: Irritability. Vag. Bleeding: None.  Movement: Present. denies leaking of fluid.      01/07/2023   10:24 AM 02/12/2019    9:09 AM 02/12/2019    9:07 AM  Depression screen PHQ 2/9  Decreased Interest 0 0 0  Down, Depressed, Hopeless 0 0 0  PHQ - 2 Score 0 0 0  Altered sleeping 0    Tired, decreased energy 0    Change in appetite 0    Feeling bad or failure about yourself  0    Trouble concentrating 0    Moving slowly or fidgety/restless 0    Suicidal thoughts 0    PHQ-9 Score 0       Current Outpatient Medications  Medication Instructions   aspirin EC 162 mg, Oral, Daily, Swallow whole.   Blood Pressure Monitor MISC For regular home bp monitoring during pregnancy   famotidine (PEPCID) 20 mg, Oral, Daily   labetalol (NORMODYNE) 200 mg, Oral, 2 times daily   omeprazole (PRILOSEC) 20 mg, Oral, Daily, 1 tablet a day   Prenatal Vit-Fe Fumarate-FA (PREPLUS) 27-1 MG TABS 1 tablet, Oral, Daily     Review of Systems:   Pertinent items are noted in HPI Denies abnormal vaginal discharge w/ itching/odor/irritation, headaches, visual changes, shortness of breath, chest pain, abdominal pain, severe nausea/vomiting, or problems with urination or bowel movements unless otherwise stated above. Pertinent History Reviewed:  Reviewed past medical,surgical, social, obstetrical and family history.  Reviewed problem list, medications and allergies. Physical Assessment:   Vitals:   06/17/23 0909  BP: 129/75  Pulse: 86  Weight: 211 lb (95.7 kg)  Body mass index is 41.21  kg/m.           Physical Examination:   General appearance: alert, well appearing, and in no distress  Mental status: alert, oriented to person, place, and time  Skin: warm & dry   Extremities:      Cardiovascular: normal heart rate noted  Respiratory: normal respiratory effort, no distress  Abdomen: gravid, soft, non-tender  Pelvic: Cervical exam performed  Dilation: 1.5 Effacement (%): Thick Station: -3 vaginal swabs collected  Fetal Status:     Movement: Present    Fetal Surveillance Testing today: Single active fetus,  cephalic  FHR = 124 bpm   posterior placenta gr1    AFI = 15.1cm  58%  SVP = 5.5cm BPP = 8/8      Umb Artery dopplers RI 0.65 75%   no apparent abn Cx long and closed   Chaperone: Faith Rogue    No results found for this or any previous visit (from the past 24 hour(s)).   Assessment & Plan:  High-risk pregnancy: G2P1001 at [redacted]w[redacted]d with an Estimated Date of Delivery: 07/14/23   1) Chronic HTN Well controlled with current medication  2) FGR Based on AC 6.8% at 34 weeks BPP 8/8, normal Dopplers Continue antepartum testing  3) Obesity  Scheduled for IOL 12/12 [place]  Meds: No orders of the defined types were placed in this encounter.   Labs/procedures today: BPP/growth, GBS today  Treatment Plan:  Routine OB care, as outlined above and will plan for induction around 37 weeks- scheduled for 12/12  Reviewed: Preterm labor symptoms and general obstetric precautions including but not limited to vaginal bleeding, contractions, leaking of fluid and fetal movement were reviewed in detail with the patient.  All questions were answered. Pt has home bp cuff. Check bp weekly, let us know if >140/90.   Follow-up: Return for twice weekly as scheduled, planning IOL on 12/11.   Future Appointments  Date Time Provider Department Center  06/20/2023 10:10 AM [facility] NURSE [facility]  06/24/2023  8:30 AM [facility] 2 [facility] None  06/24/2023  9:30 AM  Jacklyn Shell, CNM [facility]  06/27/2023 12:00 AM [facility] SCHED ROOM [facility] None  06/27/2023  9:50 AM [facility] NURSE [facility]    Orders Placed This Encounter  Procedures   Culture, beta strep (group b only)    Myna Hidalgo, DO Attending Obstetrician & Gynecologist, [facility] [employer], [facility]

## 2023-06-19 ENCOUNTER — Other Ambulatory Visit: Payer: Self-pay | Admitting: Advanced Practice Midwife

## 2023-06-19 LAB — CERVICOVAGINAL ANCILLARY ONLY
Chlamydia: NEGATIVE
Comment: NEGATIVE
Comment: NORMAL
Neisseria Gonorrhea: NEGATIVE

## 2023-06-20 ENCOUNTER — Other Ambulatory Visit: Payer: Self-pay | Admitting: Obstetrics & Gynecology

## 2023-06-20 ENCOUNTER — Ambulatory Visit: Payer: Medicaid Other

## 2023-06-20 VITALS — BP 135/79 | HR 96 | Wt 212.4 lb

## 2023-06-20 DIAGNOSIS — O10919 Unspecified pre-existing hypertension complicating pregnancy, unspecified trimester: Secondary | ICD-10-CM

## 2023-06-20 DIAGNOSIS — Z6841 Body Mass Index (BMI) 40.0 and over, adult: Secondary | ICD-10-CM

## 2023-06-20 DIAGNOSIS — O0993 Supervision of high risk pregnancy, unspecified, third trimester: Secondary | ICD-10-CM | POA: Diagnosis not present

## 2023-06-20 DIAGNOSIS — Z3A36 36 weeks gestation of pregnancy: Secondary | ICD-10-CM | POA: Diagnosis not present

## 2023-06-20 NOTE — Progress Notes (Signed)
   NURSE VISIT- NST  SUBJECTIVE:  Diana Schmitt is a 22 y.o. G82P1001 female at [redacted]w[redacted]d, here for a NST for pregnancy complicated by [facility], FGR, and Morbid obesity (BMI >=40).  She reports active fetal movement, contractions: none, vaginal bleeding: none, membranes: intact.   OBJECTIVE:  BP 135/79   Pulse 96   Wt 212 lb 6.4 oz (96.3 kg)   LMP 09/27/2022 Comment: sometime last month  BMI 41.48 kg/m   Appears well, no apparent distress  No results found for this or any previous visit (from the past 24 hour(s)).  NST: FHR baseline 125 bpm, Variability: moderate, Accelerations:present, Decelerations:  Absent= Cat 1/reactive Toco: irregular, every 10 minutes   ASSESSMENT: G2P1001 at [redacted]w[redacted]d with CHTN, FGR, and Morbid obesity (BMI >=40) NST reactive  PLAN: EFM strip reviewed by Joellyn Haff, CNM, [facility]   Recommendations: keep next appointment as scheduled    Caralyn Guile  06/20/2023 10:56 AM

## 2023-06-21 LAB — CULTURE, BETA STREP (GROUP B ONLY): Strep Gp B Culture: NEGATIVE

## 2023-06-23 NOTE — Addendum Note (Signed)
Addended by: Sharon Seller on: 06/23/2023 11:12 AM   Modules accepted: Orders

## 2023-06-24 ENCOUNTER — Encounter (HOSPITAL_COMMUNITY): Payer: Self-pay | Admitting: *Deleted

## 2023-06-24 ENCOUNTER — Encounter: Payer: Self-pay | Admitting: Advanced Practice Midwife

## 2023-06-24 ENCOUNTER — Ambulatory Visit (INDEPENDENT_AMBULATORY_CARE_PROVIDER_SITE_OTHER): Payer: Medicaid Other | Admitting: Advanced Practice Midwife

## 2023-06-24 ENCOUNTER — Ambulatory Visit: Payer: Medicaid Other | Admitting: Radiology

## 2023-06-24 ENCOUNTER — Telehealth (HOSPITAL_COMMUNITY): Payer: Self-pay | Admitting: *Deleted

## 2023-06-24 VITALS — BP 137/78 | HR 101 | Wt 214.0 lb

## 2023-06-24 DIAGNOSIS — O10919 Unspecified pre-existing hypertension complicating pregnancy, unspecified trimester: Secondary | ICD-10-CM

## 2023-06-24 DIAGNOSIS — O0990 Supervision of high risk pregnancy, unspecified, unspecified trimester: Secondary | ICD-10-CM

## 2023-06-24 DIAGNOSIS — O365990 Maternal care for other known or suspected poor fetal growth, unspecified trimester, not applicable or unspecified: Secondary | ICD-10-CM

## 2023-06-24 DIAGNOSIS — O365930 Maternal care for other known or suspected poor fetal growth, third trimester, not applicable or unspecified: Secondary | ICD-10-CM

## 2023-06-24 DIAGNOSIS — O99213 Obesity complicating pregnancy, third trimester: Secondary | ICD-10-CM

## 2023-06-24 DIAGNOSIS — Z6841 Body Mass Index (BMI) 40.0 and over, adult: Secondary | ICD-10-CM

## 2023-06-24 DIAGNOSIS — O0993 Supervision of high risk pregnancy, unspecified, third trimester: Secondary | ICD-10-CM | POA: Diagnosis not present

## 2023-06-24 DIAGNOSIS — O10913 Unspecified pre-existing hypertension complicating pregnancy, third trimester: Secondary | ICD-10-CM

## 2023-06-24 DIAGNOSIS — Z3A37 37 weeks gestation of pregnancy: Secondary | ICD-10-CM

## 2023-06-24 NOTE — Progress Notes (Signed)
HIGH-RISK PREGNANCY VISIT Patient name: Diana Schmitt MRN [id number]  Date of birth: 2001-04-22 Chief Complaint:   Routine Prenatal Visit (BPP )  History of Present Illness:   Diana Schmitt is a 22 y.o. G19P1001 female at [redacted]w[redacted]d with an Estimated Date of Delivery: 07/14/23 being seen today for ongoing management of a high-risk pregnancy complicated by chronic hypertension currently on labetalol 200mg  BID and morbid obesity BMI >=40.    Today she reports no complaints. Contractions: Irregular. Vag. Bleeding: None.  Movement: Present. denies leaking of fluid.      01/07/2023   10:24 AM 02/12/2019    9:09 AM 02/12/2019    9:07 AM  Depression screen PHQ 2/9  Decreased Interest 0 0 0  Down, Depressed, Hopeless 0 0 0  PHQ - 2 Score 0 0 0  Altered sleeping 0    Tired, decreased energy 0    Change in appetite 0    Feeling bad or failure about yourself  0    Trouble concentrating 0    Moving slowly or fidgety/restless 0    Suicidal thoughts 0    PHQ-9 Score 0          01/07/2023   10:24 AM  GAD 7 : Generalized Anxiety Score  Nervous, Anxious, on Edge 2  Control/stop worrying 2  Worry too much - different things 2  Trouble relaxing 1  Restless 0  Easily annoyed or irritable 0  Afraid - awful might happen 0  Total GAD 7 Score 7     Review of Systems:   Pertinent items are noted in HPI Denies abnormal vaginal discharge w/ itching/odor/irritation, headaches, visual changes, shortness of breath, chest pain, abdominal pain, severe nausea/vomiting, or problems with urination or bowel movements unless otherwise stated above. Pertinent History Reviewed:  Reviewed past medical,surgical, social, obstetrical and family history.  Reviewed problem list, medications and allergies. Physical Assessment:   Vitals:   06/24/23 0915  BP: 137/78  Pulse: (!) 101  Weight: 214 lb (97.1 kg)  Body mass index is 41.79 kg/m.           Physical Examination:   General appearance: alert, well appearing,  and in no distress  Mental status: alert, oriented to person, place, and time  Skin: warm & dry   Extremities: Edema: None    Cardiovascular: normal heart rate noted  Respiratory: normal respiratory effort, no distress  Abdomen: gravid, soft, non-tender  Pelvic: Cervical exam deferred         Chaperone: N/A    Fetal Status:     Movement: Present    Fetal Surveillance Testing today:   GA 37+1 Single active female fetus,  cephalic  FHR 137 bpm     AFI = 17 cm 70%  SVP = 5.1 cm Posterior pl  gr1     EFW 40%  2960g      BPP = 8/8     RI:  0.61 64% Cervix long,  closed      normal ov's - neg adnexal regions       No results found for this or any previous visit (from the past 24 hour(s)).  Assessment & Plan:  High-risk pregnancy: G2P1001 at [redacted]w[redacted]d with an Estimated Date of Delivery: 07/14/23   1. [redacted] weeks gestation of pregnancy   2. Chronic hypertension affecting pregnancy COntinue twice weekly testing  3. Morbid obesity with BMI of 40.0-44.9, adult (HCC)   4. Supervision of high risk pregnancy in third trimester IOL scheduled  for 12/12    Meds: No orders of the defined types were placed in this encounter.   Orders: No orders of the defined types were placed in this encounter.    Labs/procedures today: U/S   Reviewed: Term labor symptoms and general obstetric precautions including but not limited to vaginal bleeding, contractions, leaking of fluid and fetal movement were reviewed in detail with the patient.  All questions were answered. Does have home bp cuff. Office bp cuff given: not applicable. Check bp daily, let us know if consistently >140 and/or >90.  Follow-up: No follow-ups on file.   Future Appointments  Date Time Provider Department Center  06/27/2023 12:00 AM [facility] SCHED ROOM [facility] None    No orders of the defined types were placed in this encounter.  Jacklyn Shell , DNP, CNM [facility] 06/24/2023 9:39 AM

## 2023-06-24 NOTE — Progress Notes (Deleted)
HIGH-RISK PREGNANCY VISIT Patient name: Diana Schmitt MRN [id number]  Date of birth: [birth year] Chief Complaint:   Routine Prenatal Visit (BPP )  History of Present Illness:   Diana Schmitt is a 22 y.o. G21P1001 female at [redacted]w[redacted]d with an Estimated Date of Delivery: 07/14/23 being seen today for ongoing management of a high-risk pregnancy complicated by chronic hypertension currently on labetalol 200mg  BID and morbid obesity BMI >=40.    Today she reports {pregnancy symptoms:25616::"no complaints"}. Contractions: Irregular. Vag. Bleeding: None.  Movement: Present. denies leaking of fluid.      01/07/2023   10:24 AM 02/12/2019    9:09 AM 02/12/2019    9:07 AM  Depression screen PHQ 2/9  Decreased Interest 0 0 0  Down, Depressed, Hopeless 0 0 0  PHQ - 2 Score 0 0 0  Altered sleeping 0    Tired, decreased energy 0    Change in appetite 0    Feeling bad or failure about yourself  0    Trouble concentrating 0    Moving slowly or fidgety/restless 0    Suicidal thoughts 0    PHQ-9 Score 0          01/07/2023   10:24 AM  GAD 7 : Generalized Anxiety Score  Nervous, Anxious, on Edge 2  Control/stop worrying 2  Worry too much - different things 2  Trouble relaxing 1  Restless 0  Easily annoyed or irritable 0  Afraid - awful might happen 0  Total GAD 7 Score 7     Review of Systems:   Pertinent items are noted in HPI Denies abnormal vaginal discharge w/ itching/odor/irritation, headaches, visual changes, shortness of breath, chest pain, abdominal pain, severe nausea/vomiting, or problems with urination or bowel movements unless otherwise stated above. Pertinent History Reviewed:  Reviewed past medical,surgical, social, obstetrical and family history.  Reviewed problem list, medications and allergies. Physical Assessment:   Vitals:   06/24/23 0915  BP: 137/78  Pulse: (!) 101  Weight: 214 lb (97.1 kg)  Body mass index is 41.79 kg/m.           Physical Examination:   General  appearance: alert, well appearing, and in no distress  Mental status: alert, oriented to person, place, and time  Skin: warm & dry   Extremities: Edema: None    Cardiovascular: normal heart rate noted  Respiratory: normal respiratory effort, no distress  Abdomen: gravid, soft, non-tender  Pelvic: {Blank single:19197::"Cervical exam performed","Cervical exam deferred"}         Chaperone: {Chaperone:19197::"N/A","pt declined","Latisha Cresenzo","Janet Young","Amanda Andrews","Peggy Dones","Sherry, RN"}    Fetal Status:     Movement: Present    Fetal Surveillance Testing today:   GA 37+1 Single active female fetus,  cephalic  FHR 137 bpm     AFI = 17 cm 70%  SVP = 5.1 cm Posterior pl  gr1     EFW 40%  2960g      BPP = 8/8     RI:  0.61 64% Cervix long,  closed      normal ov's - neg adnexal regions       No results found for this or any previous visit (from the past 24 hour(s)).  Assessment & Plan:  High-risk pregnancy: G2P1001 at [redacted]w[redacted]d with an Estimated Date of Delivery: 07/14/23   1. [redacted] weeks gestation of pregnancy   2. Chronic hypertension affecting pregnancy COntinue twice weekly testing  3. Morbid obesity with BMI of 40.0-44.9, adult (HCC)   4.  Supervision of high risk pregnancy in third trimester IOL scheduled for 12/12    Meds: No orders of the defined types were placed in this encounter.   Orders: No orders of the defined types were placed in this encounter.    Labs/procedures today: {ob lab/procedures:25214}   Reviewed: Term labor symptoms and general obstetric precautions including but not limited to vaginal bleeding, contractions, leaking of fluid and fetal movement were reviewed in detail with the patient.  All questions were answered. Does have home bp cuff. Office bp cuff given: {yes/no/default n/a:21102::"not applicable"}. Check bp {weekly daily:25388::"weekly"}, let us know if consistently {pregnant bp:25389::">140 and/or >90"}.  Follow-up: No follow-ups  on file.   Future Appointments  Date Time Provider Department Center  06/27/2023 12:00 AM [facility] SCHED ROOM [facility] None    No orders of the defined types were placed in this encounter.  Jacklyn Shell , DNP, CNM [facility] 06/24/2023 9:37 AM

## 2023-06-24 NOTE — Telephone Encounter (Signed)
Preadmission screen  

## 2023-06-24 NOTE — Progress Notes (Signed)
GA 37+1 Single active female fetus,  cephalic  FHR 137 bpm     AFI = 17 cm 70%  SVP = 5.1 cm Posterior pl  gr1     EFW 40%  2960g      BPP = 8/8     RI:  0.61 64% Cervix long,  closed      normal ov's - neg adnexal regions

## 2023-06-25 ENCOUNTER — Other Ambulatory Visit: Payer: Self-pay | Admitting: Advanced Practice Midwife

## 2023-06-26 ENCOUNTER — Other Ambulatory Visit: Payer: Self-pay | Admitting: Advanced Practice Midwife

## 2023-06-27 ENCOUNTER — Other Ambulatory Visit: Payer: Self-pay

## 2023-06-27 ENCOUNTER — Encounter (HOSPITAL_COMMUNITY): Payer: Self-pay | Admitting: Obstetrics & Gynecology

## 2023-06-27 ENCOUNTER — Inpatient Hospital Stay (HOSPITAL_COMMUNITY): Payer: Medicaid Other | Admitting: Anesthesiology

## 2023-06-27 ENCOUNTER — Inpatient Hospital Stay (HOSPITAL_COMMUNITY)
Admission: RE | Admit: 2023-06-27 | Discharge: 2023-06-29 | DRG: 807 | Disposition: A | Discharge status: Home / Self Care | Payer: Medicaid Other | Attending: Family Medicine | Admitting: Family Medicine

## 2023-06-27 ENCOUNTER — Inpatient Hospital Stay (HOSPITAL_COMMUNITY): Payer: Medicaid Other

## 2023-06-27 ENCOUNTER — Other Ambulatory Visit: Payer: Medicaid Other

## 2023-06-27 DIAGNOSIS — E66813 Obesity, class 3: Secondary | ICD-10-CM | POA: Diagnosis not present

## 2023-06-27 DIAGNOSIS — O9902 Anemia complicating childbirth: Secondary | ICD-10-CM | POA: Diagnosis present

## 2023-06-27 DIAGNOSIS — Z3A37 37 weeks gestation of pregnancy: Secondary | ICD-10-CM | POA: Diagnosis not present

## 2023-06-27 DIAGNOSIS — O99214 Obesity complicating childbirth: Secondary | ICD-10-CM | POA: Diagnosis present

## 2023-06-27 DIAGNOSIS — Z560 Unemployment, unspecified: Secondary | ICD-10-CM | POA: Diagnosis not present

## 2023-06-27 DIAGNOSIS — O365930 Maternal care for other known or suspected poor fetal growth, third trimester, not applicable or unspecified: Secondary | ICD-10-CM | POA: Diagnosis not present

## 2023-06-27 DIAGNOSIS — O1092 Unspecified pre-existing hypertension complicating childbirth: Principal | ICD-10-CM | POA: Diagnosis present

## 2023-06-27 DIAGNOSIS — Z7982 Long term (current) use of aspirin: Secondary | ICD-10-CM | POA: Diagnosis not present

## 2023-06-27 DIAGNOSIS — O10919 Unspecified pre-existing hypertension complicating pregnancy, unspecified trimester: Secondary | ICD-10-CM | POA: Diagnosis not present

## 2023-06-27 DIAGNOSIS — O1002 Pre-existing essential hypertension complicating childbirth: Secondary | ICD-10-CM | POA: Diagnosis not present

## 2023-06-27 LAB — CBC
HCT: 30.5 % — ABNORMAL LOW (ref 36.0–46.0)
HCT: 30.4 % — ABNORMAL LOW (ref 36.0–46.0)
Hemoglobin: 9.9 g/dL — ABNORMAL LOW (ref 12.0–15.0)
Hemoglobin: 10.1 g/dL — ABNORMAL LOW (ref 12.0–15.0)
MCH: 26.8 pg (ref 26.0–34.0)
MCH: 27.2 pg (ref 26.0–34.0)
MCHC: 32.5 g/dL (ref 30.0–36.0)
MCHC: 33.2 g/dL (ref 30.0–36.0)
MCV: 82.4 fL (ref 80.0–100.0)
MCV: 81.7 fL (ref 80.0–100.0)
Platelets: 175 10*3/uL (ref 150–400)
Platelets: 157 10*3/uL (ref 150–400)
RBC: 3.7 MIL/uL — ABNORMAL LOW (ref 3.87–5.11)
RBC: 3.72 MIL/uL — ABNORMAL LOW (ref 3.87–5.11)
RDW: 13.7 % (ref 11.5–15.5)
RDW: 13.8 % (ref 11.5–15.5)
WBC: 7.6 10*3/uL (ref 4.0–10.5)
WBC: 7.4 10*3/uL (ref 4.0–10.5)
nRBC: 0 % (ref 0.0–0.2)
nRBC: 0 % (ref 0.0–0.2)

## 2023-06-27 LAB — RPR: RPR Ser Ql: NONREACTIVE

## 2023-06-27 LAB — TYPE AND SCREEN
ABO/RH(D): O POS
Antibody Screen: NEGATIVE

## 2023-06-27 MED ORDER — OXYTOCIN-SODIUM CHLORIDE 30-0.9 UT/500ML-% IV SOLN
2.5000 [IU]/h | INTRAVENOUS | Status: DC
  Filled 2023-06-27: qty 500

## 2023-06-27 MED ORDER — COCONUT OIL OIL: 1.0000 | TOPICAL_OIL | Status: DC | PRN

## 2023-06-27 MED ORDER — OXYCODONE-ACETAMINOPHEN 5-325 MG PO TABS: 2.0000 | ORAL_TABLET | ORAL | Status: DC | PRN

## 2023-06-27 MED ORDER — TERBUTALINE SULFATE 1 MG/ML IJ SOLN: 0.2500 mg | Freq: Once | INTRAMUSCULAR | Status: DC | PRN

## 2023-06-27 MED ORDER — LABETALOL HCL 200 MG PO TABS
200.0000 mg | ORAL_TABLET | Freq: Two times a day (BID) | ORAL | Status: DC
  Administered 2023-06-27 (×3): 200 mg via ORAL
  Filled 2023-06-27 (×5): qty 1

## 2023-06-27 MED ORDER — SODIUM CHLORIDE 0.9% FLUSH: 10.0000 mL | Freq: Two times a day (BID) | INTRAVENOUS | Status: DC

## 2023-06-27 MED ORDER — FUROSEMIDE 20 MG PO TABS
20.0000 mg | ORAL_TABLET | Freq: Every day | ORAL | Status: DC
  Administered 2023-06-27 – 2023-06-29 (×3): 20 mg via ORAL
  Filled 2023-06-27 (×3): qty 1

## 2023-06-27 MED ORDER — LIDOCAINE HCL (PF) 1 % IJ SOLN: 30.0000 mL | INTRAMUSCULAR | Status: DC | PRN

## 2023-06-27 MED ORDER — MISOPROSTOL 25 MCG QUARTER TABLET: 25.0000 ug | ORAL_TABLET | Freq: Once | ORAL | Status: DC

## 2023-06-27 MED ORDER — EPHEDRINE 5 MG/ML INJ: 10.0000 mg | INTRAVENOUS | Status: DC | PRN

## 2023-06-27 MED ORDER — DIPHENHYDRAMINE HCL 50 MG/ML IJ SOLN: 12.5000 mg | INTRAMUSCULAR | Status: DC | PRN

## 2023-06-27 MED ORDER — NIFEDIPINE ER OSMOTIC RELEASE 30 MG PO TB24
30.0000 mg | ORAL_TABLET | Freq: Every day | ORAL | Status: DC
  Administered 2023-06-27 – 2023-06-29 (×3): 30 mg via ORAL
  Filled 2023-06-27 (×3): qty 1

## 2023-06-27 MED ORDER — ONDANSETRON HCL 4 MG/2ML IJ SOLN: 4.0000 mg | Freq: Four times a day (QID) | INTRAMUSCULAR | Status: DC | PRN

## 2023-06-27 MED ORDER — PHENYLEPHRINE 80 MCG/ML (10ML) SYRINGE FOR IV PUSH (FOR BLOOD PRESSURE SUPPORT): 80.0000 ug | PREFILLED_SYRINGE | INTRAVENOUS | Status: DC | PRN

## 2023-06-27 MED ORDER — LACTATED RINGERS IV SOLN: 500.0000 mL | INTRAVENOUS | Status: DC | PRN

## 2023-06-27 MED ORDER — SOD CITRATE-CITRIC ACID 500-334 MG/5ML PO SOLN: 30.0000 mL | ORAL | Status: DC | PRN

## 2023-06-27 MED ORDER — SENNOSIDES-DOCUSATE SODIUM 8.6-50 MG PO TABS
2.0000 | ORAL_TABLET | ORAL | Status: DC
  Administered 2023-06-28 – 2023-06-29 (×2): 2 via ORAL
  Filled 2023-06-27 (×2): qty 2

## 2023-06-27 MED ORDER — OXYTOCIN BOLUS FROM INFUSION
333.0000 mL | Freq: Once | INTRAVENOUS | Status: AC
  Administered 2023-06-27: 333 mL via INTRAVENOUS

## 2023-06-27 MED ORDER — MISOPROSTOL 50MCG HALF TABLET: 50.0000 ug | ORAL_TABLET | Freq: Once | ORAL | Status: DC

## 2023-06-27 MED ORDER — IBUPROFEN 800 MG PO TABS
800.0000 mg | ORAL_TABLET | Freq: Three times a day (TID) | ORAL | Status: DC
  Administered 2023-06-27 – 2023-06-29 (×6): 800 mg via ORAL
  Filled 2023-06-27 (×6): qty 1

## 2023-06-27 MED ORDER — ONDANSETRON HCL 4 MG/2ML IJ SOLN: 4.0000 mg | INTRAMUSCULAR | Status: DC | PRN

## 2023-06-27 MED ORDER — SODIUM CHLORIDE 0.9% FLUSH: 3.0000 mL | Freq: Two times a day (BID) | INTRAVENOUS | Status: DC

## 2023-06-27 MED ORDER — SIMETHICONE 80 MG PO CHEW: 80.0000 mg | CHEWABLE_TABLET | ORAL | Status: DC | PRN

## 2023-06-27 MED ORDER — BENZOCAINE-MENTHOL 20-0.5 % EX AERO: 1.0000 | INHALATION_SPRAY | CUTANEOUS | Status: DC | PRN

## 2023-06-27 MED ORDER — DIBUCAINE (PERIANAL) 1 % EX OINT: 1.0000 | TOPICAL_OINTMENT | CUTANEOUS | Status: DC | PRN

## 2023-06-27 MED ORDER — PRENATAL MULTIVITAMIN CH
1.0000 | ORAL_TABLET | Freq: Every day | ORAL | Status: DC
  Administered 2023-06-28: 1 via ORAL
  Filled 2023-06-27: qty 1

## 2023-06-27 MED ORDER — LIDOCAINE-EPINEPHRINE (PF) 1.5 %-1:200000 IJ SOLN: INTRAMUSCULAR | Status: DC | PRN

## 2023-06-27 MED ORDER — MEASLES, MUMPS & RUBELLA VAC IJ SOLR: 0.5000 mL | Freq: Once | INTRAMUSCULAR | Status: DC

## 2023-06-27 MED ORDER — LIDOCAINE-EPINEPHRINE (PF) 1.5 %-1:200000 IJ SOLN
INTRAMUSCULAR | Status: DC | PRN
  Administered 2023-06-27: 2 mL via EPIDURAL
  Administered 2023-06-27: 3 mL via EPIDURAL

## 2023-06-27 MED ORDER — WITCH HAZEL-GLYCERIN EX PADS: 1.0000 | MEDICATED_PAD | CUTANEOUS | Status: DC | PRN

## 2023-06-27 MED ORDER — ACETAMINOPHEN 325 MG PO TABS
650.0000 mg | ORAL_TABLET | ORAL | Status: DC | PRN
  Administered 2023-06-29: 650 mg via ORAL
  Filled 2023-06-27: qty 2

## 2023-06-27 MED ORDER — OXYCODONE-ACETAMINOPHEN 5-325 MG PO TABS: 1.0000 | ORAL_TABLET | ORAL | Status: DC | PRN

## 2023-06-27 MED ORDER — DIPHENHYDRAMINE HCL 25 MG PO CAPS: 25.0000 mg | ORAL_CAPSULE | Freq: Four times a day (QID) | ORAL | Status: DC | PRN

## 2023-06-27 MED ORDER — LACTATED RINGERS IV SOLN: INTRAVENOUS | Status: DC

## 2023-06-27 MED ORDER — ONDANSETRON HCL 4 MG PO TABS: 4.0000 mg | ORAL_TABLET | ORAL | Status: DC | PRN

## 2023-06-27 MED ORDER — SODIUM CHLORIDE 0.9% FLUSH: 3.0000 mL | INTRAVENOUS | Status: DC | PRN

## 2023-06-27 MED ORDER — ACETAMINOPHEN 325 MG PO TABS: 650.0000 mg | ORAL_TABLET | ORAL | Status: DC | PRN

## 2023-06-27 MED ORDER — FENTANYL-BUPIVACAINE-NACL 0.5-0.125-0.9 MG/250ML-% EP SOLN
12.0000 mL/h | EPIDURAL | Status: DC | PRN
  Administered 2023-06-27: 12 mL/h via EPIDURAL
  Filled 2023-06-27: qty 250

## 2023-06-27 MED ORDER — MISOPROSTOL 50MCG HALF TABLET
50.0000 ug | ORAL_TABLET | Freq: Once | ORAL | Status: AC
  Administered 2023-06-27: 50 ug via ORAL
  Filled 2023-06-27: qty 1

## 2023-06-27 MED ORDER — LACTATED RINGERS IV SOLN
500.0000 mL | Freq: Once | INTRAVENOUS | Status: AC
  Administered 2023-06-27: 500 mL via INTRAVENOUS

## 2023-06-27 NOTE — H&P (Addendum)
Diana Schmitt is a 22 y.o. G2P1001 female at [redacted]w[redacted]d by [redacted]w[redacted]d u/s presenting for IOL due to [place].   Reports active fetal movement, contractions: irreg, vaginal bleeding: none, membranes: intact.  Initiated prenatal care at [facility] at 13.1 wks.   Most recent u/s [redacted]w[redacted]d, EFW 40% (2960g), AFI 17cm, post placenta, cephalic.   This pregnancy complicated by: # cHTN (Labetalol 200mg  bid) # FGR dx by [facility] 6% @ 34wks; resolved @ 37wks # BMI 41  Prenatal History/Complications:  # vag del in 2021 (gHTN, FGR)  Past Medical History: Past Medical History:  Diagnosis Date   Gestational hypertension     Past Surgical History: Past Surgical History:  Procedure Laterality Date   LIPOMA EXCISION N/A     Obstetrical History: OB History     Gravida  2   Para  1   Term  1   Preterm      AB      Living  1      SAB      IAB      Ectopic      Multiple  0   Live Births  1           Social History: Social History   Socioeconomic History   Marital status: Single    Spouse name: Not on file   Number of children: Not on file   Years of education: Not on file   Highest education level: 12th grade  Occupational History   Occupation: unemployed  Tobacco Use   Smoking status: Never   Smokeless tobacco: Never  Vaping Use   Vaping status: Never Used  Substance and Sexual Activity   Alcohol use: Not Currently   Drug use: Never   Sexual activity: Yes    Birth control/protection: None  Other Topics Concern   Not on file  Social History Narrative   Not on file   Social Drivers of Health   Financial Resource Strain: Low Risk  (01/07/2023)   Overall Financial Resource Strain (CARDIA)    Difficulty of Paying Living Expenses: Not hard at all  Food Insecurity: No Food Insecurity (06/27/2023)   Hunger Vital Sign    Worried About Running Out of Food in the Last Year: Never true    Ran Out of Food in the Last Year: Never true  Transportation Needs: No Transportation Needs  (06/27/2023)   PRAPARE - Administrator, Civil Service (Medical): No    Lack of Transportation (Non-Medical): No  Physical Activity: Insufficiently Active (01/07/2023)   Exercise Vital Sign    Days of Exercise per Week: 3 days    Minutes of Exercise per Session: 30 min  Stress: No Stress Concern Present (01/07/2023)   [employer] of Occupational Health - Occupational Stress Questionnaire    Feeling of Stress : Only a little  Social Connections: Moderately Integrated (01/07/2023)   Social Connection and Isolation Panel [NHANES]    Frequency of Communication with Friends and Family: Twice a week    Frequency of Social Gatherings with Friends and Family: Twice a week    Attends Religious Services: More than 4 times per year    Active Member of [employer] or Organizations: No    Attends Banker Meetings: Never    Marital Status: Living with partner    Family History: No family history on file.  Allergies: No Known Allergies  Medications Prior to Admission  Medication Sig Dispense Refill Last Dose/Taking   aspirin EC 81  MG tablet Take 2 tablets (162 mg total) by mouth daily. Swallow whole. 180 tablet 2 06/26/2023   famotidine (PEPCID) 40 MG/5ML suspension Take 20 mg by mouth daily.   06/27/2023   labetalol (NORMODYNE) 200 MG tablet Take 1 tablet (200 mg total) by mouth 2 (two) times daily. 60 tablet 3 06/27/2023   omeprazole (PRILOSEC) 20 MG capsule Take 1 capsule (20 mg total) by mouth daily. 1 tablet a day 30 capsule 6 06/27/2023   Prenatal Vit-Fe Fumarate-FA (PREPLUS) 27-1 MG TABS Take 1 tablet by mouth daily. 30 tablet 13 06/27/2023   Blood Pressure Monitor MISC For regular home bp monitoring during pregnancy 1 each 0     Review of Systems  Pertinent pos/neg as indicated in HPI  Blood pressure 136/74, pulse 94, temperature 98.7 F (37.1 C), height 5' (1.524 m), weight 97.4 kg, last menstrual period 09/27/2022. General appearance: alert, cooperative, and  no distress Lungs: comfortable work of breathing, speaks in full sentences, no audible wheeze Heart: regular rate and rhythm, appears well perfused  Abdomen: gravid, soft, non-tender, EFW by Leopold's approximately 6 lbs Extremities: 1+ edema  Fetal monitoring: FHR: 125-135 bpm, variability: moderate,  Accelerations: Present,  decelerations:  Absent Uterine activity: irreg, mild Dilation: 3 Effacement (%): 30 Station: -3 Exam by:: Dr. Corliss Skains Presentation: cephalic   Prenatal labs: ABO, Rh: --/--/O POS (12/12 0110) Antibody: NEG (12/12 0110) Rubella: 1.66 (06/24 1221) RPR: Non Reactive (10/09 0911)  HBsAg: Negative (06/24 1221)  HIV: Non Reactive (10/09 0911)  GBS: Negative/-- (12/02 0206)  2hr GTT: 73/135/73  Prenatal Transfer Tool  Maternal Diabetes: No Genetic Screening: Normal Maternal Ultrasounds/Referrals: Other: resolved FGR Fetal Ultrasounds or other Referrals:  None Maternal Substance Abuse:  No Significant Maternal Medications:  Meds include: Other: Labetalol Significant Maternal Lab Results: Group B Strep negative  Results for orders placed or performed during the hospital encounter of 06/27/23 (from the past 24 hours)  CBC   Collection Time: 06/27/23  1:10 AM  Result Value Ref Range   WBC 7.6 4.0 - 10.5 K/uL   RBC 3.70 (L) 3.87 - 5.11 MIL/uL   Hemoglobin 9.9 (L) 12.0 - 15.0 g/dL   HCT 01.0 (L) 27.2 - 53.6 %   MCV 82.4 80.0 - 100.0 fL   MCH 26.8 26.0 - 34.0 pg   MCHC 32.5 30.0 - 36.0 g/dL   RDW 64.4 03.4 - 74.2 %   Platelets 175 150 - 400 K/uL   nRBC 0.0 0.0 - 0.2 %  Type and screen   Collection Time: 06/27/23  1:10 AM  Result Value Ref Range   ABO/RH(D) O POS    Antibody Screen NEG    Sample Expiration      06/30/2023,2359 Performed at [facility], [address] [place], [place] [address]      Assessment:  [redacted]w[redacted]d SIUP  G2P1001  cHTN - did not take pm dose labetalol will give here. Remains asymptomatic   Cat 1 FHR  GBS Negative/--  (12/02 0206)  Plan:  Admit to L&D  IV pain meds/epidural prn active labor  Plan cx ripening with cytotec, augment with AROM / pitocin as needed   Anticipate vag delivery   Plans to breastfeed  Contraception: condoms  Circumcision: yes  Nelta Numbers MD  Visiting Resident PGY3 - Family Medicine  06/27/2023, 2:27 AM    CNM attestation:  I have seen and examined this patient; I agree with above documentation in the resident's note.   Diana Schmitt  is a 22 y.o. G2P1001 here for IOL due to cHTN  PE: BP 118/60   Pulse 74   Temp 98.1 F (36.7 C) (Oral)   Resp 18   Ht 5' (1.524 m)   Wt 97.4 kg   LMP 09/27/2022 Comment: sometime last month  BMI 41.93 kg/m  Gen: calm comfortable, NAD Resp: normal effort, no distress Abd: gravid  ROS, labs, PMH reviewed  Plan: -Admit to Labor and Delivery -Cytotec to start; then proceed with AROM and/or Pitocin (pt preference) -Anticipate vag delivery -Continue home Labetalol 200mg  bid dosing with plan for Lasix course PP  Diana Schmitt 06/27/2023, 6:20 AM

## 2023-06-27 NOTE — Progress Notes (Signed)
Labor Progress Note Jansen Fawbush is a 22 y.o. G2P2002 at [redacted]w[redacted]d presented for IOL 2/2 cHTN  S: In to meet pt, no concerns or questions at this time, comfortable w epidural  O:  BP 125/65 (BP Location: Right Arm)   Pulse 96   Temp 98.6 F (37 C) (Oral)   Resp 20   Ht 5' (1.524 m)   Wt 97.4 kg   LMP 09/27/2022 Comment: sometime last month  SpO2 98%   Breastfeeding Unknown   BMI 41.93 kg/m  EFM: 125/mod/+a/+variables + early decels  CVE: Dilation: 6 Effacement (%): 90 Cervical Position: Middle Station: -1 Presentation: Vertex Exam by:: Lima, rn   A&P: 22 y.o. [id number] [redacted]w[redacted]d here for IOL 2/2 cHTN #Labor: Progressing well. FB out. Regular ctx, likely transitioning to active labor, anticipate SVD #Pain: Epidural #FWB: Cat II, but reassuring #GBS negative  #cHTN: Normotensive, cont to monitor  Sundra Aland, MD 6:57 PM

## 2023-06-27 NOTE — Progress Notes (Signed)
Labor Progress Note Diana Schmitt is a 22 y.o. G2P1001 at [redacted]w[redacted]d presented for IOL 2/2 cHTN.  S: In after prolonged decel, returned to baseline w positional changes. Pt reporting urge to push.  O:  BP (!) 115/42   Pulse 98   Temp 98 F (36.7 C) (Oral)   Resp 16   Ht 5' (1.524 m)   Wt 97.4 kg   LMP 09/27/2022 Comment: sometime last month  SpO2 99%   BMI 41.93 kg/m  EFM: 115/mod/+a/variables  CVE: Dilation: 9 Effacement (%): 90 Cervical Position: Middle Station: 0 Presentation: Vertex Exam by:: Lima, rn   A&P: 22 y.o. G2P1001 [redacted]w[redacted]d here for IOL 2/2 cHTN #Labor: Progressing well. Likely prolonged decel 2/2 rapid cervical change. No 9cm, anticipate SVD very soon. #Pain: Epidural #FWB: Cat II, overall reassuring  Sundra Aland, MD 1:15 PM

## 2023-06-27 NOTE — Anesthesia Postprocedure Evaluation (Signed)
Anesthesia Post Note  Patient: Diana Schmitt  Procedure(s) Performed: AN AD HOC LABOR EPIDURAL     Patient location during evaluation: Mother Baby Anesthesia Type: Epidural Level of consciousness: awake and alert and oriented Pain management: satisfactory to patient Vital Signs Assessment: post-procedure vital signs reviewed and stable Respiratory status: respiratory function stable Cardiovascular status: stable Postop Assessment: no headache, no backache, epidural receding, patient able to bend at knees, no signs of nausea or vomiting, adequate PO intake and able to ambulate Anesthetic complications: no   No notable events documented.  Last Vitals:  Vitals:   06/27/23 1545 06/27/23 1657  BP: 131/67 125/65  Pulse: 94 96  Resp: 20 20  Temp: 36.8 C 37 C  SpO2: 99% 98%    Last Pain:  Vitals:   06/27/23 1904  TempSrc:   PainSc: 0-No pain   Pain Goal: Patients Stated Pain Goal: 0 (06/27/23 0450)                 Carlos American

## 2023-06-27 NOTE — Discharge Summary (Cosign Needed Addendum)
Postpartum Discharge Summary       Patient Name: Diana Schmitt DOB: [birth year] MRN: [id number]  Date of admission: 06/27/2023 Delivery date:06/27/2023 Delivering provider: Sundra Aland Date of discharge: 06/28/2023  Admitting diagnosis: Chronic hypertension affecting pregnancy [O10.919] Intrauterine pregnancy: [redacted]w[redacted]d     Secondary diagnosis:  Principal Problem:   SVD (spontaneous vaginal delivery) Active Problems:   Chronic hypertension affecting pregnancy   Morbid obesity with BMI of 40.0-44.9, adult [facility])  Additional problems: none    Discharge diagnosis: Term Pregnancy Delivered and CHTN                                              Post partum procedures: Augmentation: AROM and Cytotec Complications: None  Hospital course: Induction of Labor With Vaginal Delivery   22 y.o. yo [id number] at [redacted]w[redacted]d was admitted to the hospital 06/27/2023 for induction of labor.  Indication for induction:  cHTN .  Patient had an labor course complicated by nothing Membrane Rupture Time/Date: 6:01 AM,06/27/2023  Delivery Method:Vaginal, Spontaneous Operative Delivery:N/A Episiotomy: None Lacerations:  Vaginal Details of delivery can be found in separate delivery note.  Patient had a postpartum course complicated by nothing. Started on procardia and lasix. Patient is discharged home 06/28/23.  Newborn Data: Birth date:06/27/2023 Birth time:1:30 PM Gender:Female Living status:Living Apgars:9 ,9  Weight:2840 g  Magnesium Sulfate received: No BMZ received: No Rhophylac:N/A MMR:N/A T-DaP:   declined prenatally, is ordered Flu: No RSV Vaccine received: No Transfusion:No  Immunizations received: Immunization History  Administered Date(s) Administered   DTaP 12/19/1999, 01/08/2001, 09/25/2001, 07/04/2005   HIB, Unspecified 12/19/1999, 01/08/2001, 09/25/2001   HPV Quadrivalent 11/14/2013   Hepatitis A, Ped/Adol-2 Dose 10/14/2007, 11/14/2013   Hepatitis B, PED/ADOLESCENT 12/19/1999,  01/08/2001, 09/25/2001, 12/09/2013   IPV 12/19/1999, 01/08/2001, 09/25/2001, 07/04/2005   MMR 09/25/2001, 07/04/2005   Tdap 01/08/2001, 09/01/2002   Varicella 09/25/2001, 10/14/2007    Physical exam  Vitals:   06/27/23 1657 06/27/23 2115 06/28/23 0130 06/28/23 0500  BP: 125/65 124/72 108/62 116/65  Pulse: 96 88 78 84  Resp: 20 18 18 17   Temp: 98.6 F (37 C) 98 F (36.7 C) 98.9 F (37.2 C) 98.1 F (36.7 C)  TempSrc: Oral Oral Oral Oral  SpO2: 98% 99% 98% 99%  Weight:      Height:       General: alert, cooperative, and no distress Lochia: appropriate Uterine Fundus: firm Incision: N/A DVT Evaluation: No evidence of DVT seen on physical exam. Negative Homan's sign. No cords or calf tenderness. No significant calf/ankle edema. Labs: Lab Results  Component Value Date   WBC 7.4 06/27/2023   HGB 10.1 (L) 06/27/2023   HCT 30.4 (L) 06/27/2023   MCV 81.7 06/27/2023   PLT 157 06/27/2023      Latest Ref Rng & Units 01/07/2023   12:21 PM  CMP  Glucose 70 - 99 mg/dL 74   BUN 6 - 20 mg/dL 6   Creatinine 3.32 - 9.51 mg/dL 8.84   Sodium 166 - 063 mmol/L 138   Potassium 3.5 - 5.2 mmol/L 3.9   Chloride 96 - 106 mmol/L 102   CO2 20 - 29 mmol/L 16   Calcium 8.7 - 10.2 mg/dL 9.7   Total Protein 6.0 - 8.5 g/dL 7.2   Total Bilirubin 0.0 - 1.2 mg/dL <0.1   Alkaline Phos 44 - 121 IU/L 54  AST 0 - 40 IU/L 11   ALT 0 - 32 IU/L 9    Edinburgh Score:    07/30/2019   11:17 PM  Edinburgh Postnatal Depression Scale Screening Tool  I have been able to laugh and see the funny side of things. 0  I have looked forward with enjoyment to things. 0  I have blamed myself unnecessarily when things went wrong. 1  I have been anxious or worried for no good reason. 2  I have felt scared or panicky for no good reason. 2  Things have been getting on top of me. 0  I have been so unhappy that I have had difficulty sleeping. 0  I have felt sad or miserable. 0  I have been so unhappy that I have  been crying. 0  The thought of harming myself has occurred to me. 0  Edinburgh Postnatal Depression Scale Total 5   No data recorded  After visit meds:  Allergies as of 06/28/2023   No Known Allergies      Medication List     STOP taking these medications    aspirin EC 81 MG tablet   Blood Pressure Monitor Misc   famotidine 40 MG/5ML suspension Commonly known as: PEPCID   labetalol 200 MG tablet Commonly known as: NORMODYNE   omeprazole 20 MG capsule Commonly known as: PRILOSEC       TAKE these medications    acetaminophen 325 MG tablet Commonly known as: Tylenol Take 2 tablets (650 mg total) by mouth every 4 (four) hours as needed (for pain scale < 4).   furosemide 20 MG tablet Commonly known as: LASIX Take 1 tablet (20 mg total) by mouth daily.   ibuprofen 800 MG tablet Commonly known as: ADVIL Take 1 tablet (800 mg total) by mouth every 8 (eight) hours.   NIFEdipine 30 MG 24 hr tablet Commonly known as: ADALAT CC Take 1 tablet (30 mg total) by mouth daily.   PrePLUS 27-1 MG Tabs Take 1 tablet by mouth daily.         Discharge home in stable condition Infant Feeding: Bottle and Breast Infant Disposition:home with mother Discharge instruction: per After Visit Summary and Postpartum booklet. Activity: Advance as tolerated. Pelvic rest for 6 weeks.  Diet: routine diet Future Appointments:No future appointments. Follow up Visit: Message sent to [facility] 12/12  Please schedule this patient for a In person postpartum visit in 4 weeks with the following provider: Any provider. Additional Postpartum F/U:BP check 1 week  High risk pregnancy complicated by: HTN Delivery mode:  Vaginal, Spontaneous Anticipated Birth Control:  Condoms   06/28/2023 Jacklyn Shell, CNM

## 2023-06-27 NOTE — Anesthesia Preprocedure Evaluation (Signed)
Anesthesia Evaluation  Patient identified by MRN, date of birth, ID band Patient awake    Reviewed: Allergy & Precautions, H&P , Patient's Chart, lab work & pertinent test results  History of Anesthesia Complications Negative for: history of anesthetic complications  Airway Mallampati: III  TM Distance: >3 FB Neck ROM: full    Dental no notable dental hx.    Pulmonary neg pulmonary ROS   Pulmonary exam normal        Cardiovascular hypertension, Pt. on home beta blockers Normal cardiovascular exam Rhythm:regular Rate:Normal     Neuro/Psych negative neurological ROS  negative psych ROS   GI/Hepatic negative GI ROS, Neg liver ROS,,,  Endo/Other    Class 3 obesity  Renal/GU negative Renal ROS     Musculoskeletal   Abdominal  (+) + obese  Peds  Hematology negative hematology ROS (+)   Anesthesia Other Findings   Reproductive/Obstetrics (+) Pregnancy                             Anesthesia Physical Anesthesia Plan  ASA: 3  Anesthesia Plan: Epidural   Post-op Pain Management:    Induction:   PONV Risk Score and Plan:   Airway Management Planned:   Additional Equipment:   Intra-op Plan:   Post-operative Plan:   Informed Consent: I have reviewed the patients History and Physical, chart, labs and discussed the procedure including the risks, benefits and alternatives for the proposed anesthesia with the patient or authorized representative who has indicated his/her understanding and acceptance.       Plan Discussed with:   Anesthesia Plan Comments:         Anesthesia Quick Evaluation

## 2023-06-27 NOTE — Anesthesia Procedure Notes (Signed)
Epidural Patient location during procedure: OB Start time: 06/27/2023 7:40 AM End time: 06/27/2023 7:58 AM  Staffing Anesthesiologist: Lewie Loron, MD Performed: anesthesiologist   Preanesthetic Checklist Completed: patient identified, IV checked, risks and benefits discussed, monitors and equipment checked, pre-op evaluation and timeout performed  Epidural Patient position: sitting Prep: DuraPrep and site prepped and draped Patient monitoring: heart rate, continuous pulse ox and blood pressure Approach: midline Location: L2-L3 Injection technique: LOR air and LOR saline  Needle:  Needle type: Tuohy  Needle gauge: 17 G Needle length: 9 cm Needle insertion depth: 7 cm Catheter type: closed end flexible Catheter size: 19 Gauge Catheter at skin depth: 13 cm Test dose: negative and 1.5% lidocaine with Epi 1:200 K  Assessment Sensory level: T8 Events: blood not aspirated, no cerebrospinal fluid, injection not painful, no injection resistance, no paresthesia and negative IV test  Additional Notes Reason for block:procedure for pain

## 2023-06-27 NOTE — Lactation Note (Signed)
This note was copied from a baby's chart. Lactation Consultation Note  Patient Name: Diana Schmitt [id number] Date: 06/27/2023 Age:24 hours Reason for consult: Initial assessment;Early term 37-38.6wks Per MOB, infant recently BF for 10 minutes in L&D. LC reviewed hand expression with breast model and MOB easily expressed 2 mls of colostrum that was spoon feed to infant. LC suggested MOB do reverse pressure softening prior to latching infant to help evert her nipple shaft out more when infant is latch. MOB will continue to BF infant by cues, on demand, every 2-3 hours, skin to skin. LC discussed importance of pillow support. MOB knows to call for latch assistance if needed. LC discussed importance of maternal rest, balance diet and hydration. MOB was made aware of O/P services, breastfeeding support groups, community resources, and our phone # for post-discharge questions.    Maternal Data Has patient been taught Hand Expression?: Yes Does the patient have breastfeeding experience prior to this delivery?: Yes How long did the patient breastfeed?: Per MOB, she BF her 3 year olod for 2 months  Feeding Mother's Current Feeding Choice: Breast Milk and Formula  LATCH Score ( Latch assessment was done by RN in Labor and delivery) Latch:  (MOB has flat nipples, LC suggested reverse pressure softening prior to latching infant at the breast. MOB breast are compressible and responds well to stimulation.)  Audible Swallowing: None  Type of Nipple: Flat  Comfort (Breast/Nipple): Soft / non-tender  Hold (Positioning): Full assist, staff holds infant at breast  LATCH Score: 4   Lactation Tools Discussed/Used    Interventions Interventions: Breast feeding basics reviewed;Skin to skin;Breast compression;Support pillows;Position options;Hand express;Breast massage;Expressed milk  Discharge Pump: DEBP;Personal (MOB brought her DEBP from home.)  Consult Status Consult Status: Follow-up Date:  06/28/23 Follow-up type: In-patient    Frederico Hamman 06/27/2023, 4:39 PM

## 2023-06-27 NOTE — Progress Notes (Signed)
Patient ID: Bunice Delahanty, female   DOB: Aug 13, 2000, 22 y.o.   MRN: [id number]  Feeling a little crampy after oral cytotec dose  BPs 118/60, 120/69 FHR 120s, +accels, no decels Ctx irreg 2-5 mins Cx 3/50/vtx -2; AROM for clear fluid  Hgb 9.9  IUP@37 .4wks cHTN Mild anemia Cx favorable  -Discussion of using Pitocin and/or AROM- pt elects AROM to start; will give AROM alone a few hours to see if Pitocin is required to get active labor started -Continue home Labetalol 200mg  bid dosing -Anticipate vag delivery  Arabella Merles CNM 06/27/2023 6:15 AM

## 2023-06-28 ENCOUNTER — Other Ambulatory Visit (HOSPITAL_COMMUNITY): Payer: Self-pay

## 2023-06-28 MED ORDER — ACETAMINOPHEN 325 MG PO TABS: 650.0000 mg | ORAL_TABLET | ORAL | Status: DC | PRN

## 2023-06-28 MED ORDER — NIFEDIPINE ER 30 MG PO TB24
30.0000 mg | ORAL_TABLET | Freq: Every day | ORAL | 0 refills | Status: DC
  Filled 2023-06-28: qty 90, 90d supply, fill #0

## 2023-06-28 MED ORDER — IBUPROFEN 800 MG PO TABS
800.0000 mg | ORAL_TABLET | Freq: Three times a day (TID) | ORAL | 0 refills | Status: DC
  Filled 2023-06-28: qty 30, 10d supply, fill #0

## 2023-06-28 MED ORDER — FUROSEMIDE 20 MG PO TABS
20.0000 mg | ORAL_TABLET | Freq: Every day | ORAL | 0 refills | Status: DC
  Filled 2023-06-28: qty 5, 5d supply, fill #0

## 2023-06-28 NOTE — Lactation Note (Signed)
This note was copied from a baby's chart. Lactation Consultation Note  Patient Name: Diana Schmitt [id number] Date: 06/28/2023 Age:25 hours  Stork pump requested this morning- DELIVERED- [employer].  Taylie Helder 06/28/2023, 3:09 PM

## 2023-06-29 NOTE — Progress Notes (Signed)
Post Partum Day 1 (late entry) Subjective: No complaints, up ad lib, voiding, and tolerating PO.  Patient denies any headaches, visual symptoms, RUQ/epigastric pain or other concerning symptoms.  Objective:  Vitals:   06/28/23 0500  BP: 116/65  Pulse: 84  Resp: 17  Temp: 98.1 F (36.7 C)  TempSrc: Oral  SpO2: 99%  Weight:   Height:    Physical Exam:  General: alert, cooperative, and no distress Lochia: appropriate Uterine Fundus: firm Incision: N/A DVT Evaluation: No evidence of DVT seen on physical exam. Negative Homan's sign. No cords or calf tenderness. No significant calf/ankle edema.  Recent Labs    06/27/23 0110 06/27/23 0621  HGB 9.9* 10.1*  HCT 30.5* 30.4*   Assessment/Plan: Continue Procardia XL 30 mg daily and Lasix for BP control Routine postpartum care Breastfeeding, condoms for postpartum contraception.   LOS: 1 days   Jaynie Collins, MD

## 2023-07-01 ENCOUNTER — Other Ambulatory Visit: Payer: Medicaid Other | Admitting: Radiology

## 2023-07-01 ENCOUNTER — Encounter: Payer: Medicaid Other | Admitting: Obstetrics & Gynecology

## 2023-07-04 ENCOUNTER — Other Ambulatory Visit: Payer: Medicaid Other

## 2023-07-04 ENCOUNTER — Telehealth (HOSPITAL_COMMUNITY): Payer: Self-pay | Admitting: *Deleted

## 2023-07-04 NOTE — Telephone Encounter (Signed)
07/04/2023  Name: TASHIRA ROSEBROCK MRN: [id number] DOB: Sep 30, 2000  Reason for Call:  Transition of Care Hospital Discharge Call  Contact Status: Patient Contact Status: Message  Language assistant needed:          Follow-Up Questions:    Inocente Salles Postnatal Depression Scale:  In the Past 7 Days:    PHQ2-9 Depression Scale:     Discharge Follow-up:    Post-discharge interventions: NA  Salena Saner, RN 07/04/2023 19:08

## 2023-07-08 ENCOUNTER — Other Ambulatory Visit: Payer: Medicaid Other

## 2023-07-08 ENCOUNTER — Encounter: Payer: Medicaid Other | Admitting: Women's Health

## 2023-07-11 ENCOUNTER — Other Ambulatory Visit: Payer: Medicaid Other

## 2023-07-19 ENCOUNTER — Encounter: Payer: Self-pay | Admitting: Obstetrics & Gynecology

## 2023-08-01 ENCOUNTER — Encounter: Payer: Self-pay | Admitting: Obstetrics & Gynecology

## 2023-08-01 ENCOUNTER — Ambulatory Visit: Payer: Medicaid Other | Admitting: Obstetrics & Gynecology

## 2023-08-01 DIAGNOSIS — I10 Essential (primary) hypertension: Secondary | ICD-10-CM

## 2023-08-01 MED ORDER — NIFEDIPINE ER OSMOTIC RELEASE 30 MG PO TB24: 30.0000 mg | ORAL_TABLET | Freq: Every day | ORAL | 4 refills | Status: DC

## 2023-08-01 NOTE — Progress Notes (Signed)
POSTPARTUM VISIT Patient name: Diana Schmitt MRN [id number]  Date of birth: 2001-07-09 Chief Complaint:   Postpartum Care  History of Present Illness:   Diana Schmitt is a 23 y.o. [phone] female being seen today for a postpartum visit. She is 4 weeks postpartum following a spontaneous vaginal delivery at [redacted]w[redacted]d gestational weeks. IOL: Yes, for  chronic HTN- On Labetalol 200mg  bid .   Pt discharged home on ProcardiaXL 30mg  daily and Lasix- seems as though she was on it, but not currently   Pregnancy complicated by cHTN .  Last pap smear: 12/2022    Postpartum course has been uncomplicated.  Bleeding no bleeding. Bowel function is normal. Bladder function is normal. Urinary incontinence? No, fecal incontinence? No Patient is not sexually active. Last sexual activity: prior to delivery.   Desired contraception: Condoms. Patient does not want a pregnancy in the future.   Desired family size is 2 children.   The pregnancy intention screening data noted above was reviewed. Potential methods of contraception were discussed. The patient elected to proceed with No data recorded.  Edinburgh Postpartum Depression Screening: Negative  Edinburgh Postnatal Depression Scale - 08/01/23 1339       Edinburgh Postnatal Depression Scale:  In the Past 7 Days   I have been able to laugh and see the funny side of things. 0    I have looked forward with enjoyment to things. 0    I have blamed myself unnecessarily when things went wrong. 1    I have been anxious or worried for no good reason. 0    I have felt scared or panicky for no good reason. 0    Things have been getting on top of me. 0    I have been so unhappy that I have had difficulty sleeping. 0    I have felt sad or miserable. 0    I have been so unhappy that I have been crying. 0    The thought of harming myself has occurred to me. 0    Edinburgh Postnatal Depression Scale Total 1             Baby's course has been uncomplicated.  Baby is feeding by breast and bottle: milk supply adequate. Infant has a pediatrician/family doctor? Yes.  Childcare strategy if returning to work/school: staying at home.  Pt has material needs met for her and baby: Yes.    Review of Systems:   Pertinent items are noted in HPI Denies Abnormal vaginal discharge w/ itching/odor/irritation, headaches, visual changes, shortness of breath, chest pain, abdominal pain, severe nausea/vomiting, or problems with urination or bowel movements. Pertinent History Reviewed:  Reviewed past medical,surgical, obstetrical and family history.  Reviewed problem list, medications and allergies. OB History  Gravida Para Term Preterm AB Living  2 2 2   2   SAB IAB Ectopic Multiple Live Births     0 2    # Outcome Date GA Lbr Len/2nd Weight Sex Type Anes PTL Lv  2 Term 06/27/23 [redacted]w[redacted]d 07:23 / 00:06 6 lb 4.2 oz (2.84 kg) M Vag-Spont EPI  LIV  1 Term 07/30/19 [redacted]w[redacted]d 13:18 / 00:09 5 lb 2.5 oz (2.339 kg) F Vag-Spont EPI N LIV     Complications: Fetal growth restriction antepartum, Gestational hypertension   Physical Assessment:   Vitals:   08/01/23 1338 08/01/23 1357  BP: (!) 158/99 (!) 158/95  Pulse: (!) 103 92  Weight: 197 lb (89.4 kg)   Height: 5\' 1"  (1.549  m)   Body mass index is 37.22 kg/m.       Physical Examination:   General appearance: alert, well appearing, and in no distress  Mental status: normal mood, behavior, speech, dress, motor activity, and thought processes  Skin: warm & dry   Cardiovascular: RRR  Respiratory: CTAB  Breasts: no masses or abnormalities noted   Abdomen: soft, non-tender   Pelvic: normal external genitalia, vulva, vagina, cervix, uterus and adnexa  Extremities: no edema  Chaperone:  pt declined           Assessment & Plan:  1) Postpartum exam 2) 4 wks s/p spontaneous vaginal delivery 3) breast & bottle feeding 4) Depression screening 5) Contraception counseling  Essential components of care per ACOG  recommendations:  1.  Mood and well being:  If positive depression screen, discussed and plan developed.  If using tobacco we discussed reduction/cessation and risk of relapse If current substance abuse, we discussed and referral to local resources was offered.   2. Infant care and feeding:  If breastfeeding, discussed returning to work, pumping, breastfeeding-associated pain, guidance regarding return to fertility while lactating if not using another method Recommended that all caregivers be immunized for flu, pertussis and other preventable communicable diseases  3. Sexuality, contraception and birth spacing Provided guidance regarding sexuality, management of dyspareunia, and resumption of intercourse Discussed avoiding interpregnancy interval <69mths and recommended birth spacing of 18 months  4. Sleep and fatigue Discussed coping options for fatigue and sleep disruption Encouraged family/partner/community support of 4 hrs of uninterrupted sleep to help with mood and fatigue  5. Physical recovery  Encouraged pelvic floor exercises Patient is safe to resume physical activity. Discussed attainment of healthy weight.  6.  Chronic disease management -pt ran out of procardia and does not currently have PCP -will plan to restart medication for now and strongly encouraged pt to establish care with PCP within the next few mos -Rx sent in for now Discussed pregnancy complications if any, and their implications for future childbearing and long-term maternal health. Pt had GDM: No. If yes, 2hr GTT scheduled: not applicable.  7. Health maintenance Mammogram at 25yo or earlier if indicated Pap smears up to date, 2024  Meds:  Meds ordered this encounter  Medications   NIFEdipine (PROCARDIA XL) 30 MG 24 hr tablet    Sig: Take 1 tablet (30 mg total) by mouth daily.    Dispense:  30 tablet    Refill:  4    Follow-up: Return in about 1 year (around 07/31/2024) for Annual.    Myna Hidalgo, DO Attending Obstetrician & Gynecologist, [facility], [facility]

## 2023-08-31 DIAGNOSIS — N760 Acute vaginitis: Secondary | ICD-10-CM | POA: Diagnosis not present

## 2023-08-31 DIAGNOSIS — N898 Other specified noninflammatory disorders of vagina: Secondary | ICD-10-CM | POA: Diagnosis not present

## 2023-08-31 DIAGNOSIS — R8290 Unspecified abnormal findings in urine: Secondary | ICD-10-CM | POA: Diagnosis not present

## 2023-11-01 ENCOUNTER — Other Ambulatory Visit: Payer: Self-pay | Admitting: Obstetrics & Gynecology

## 2023-11-01 DIAGNOSIS — I10 Essential (primary) hypertension: Secondary | ICD-10-CM

## 2023-11-05 MED ORDER — NIFEDIPINE ER OSMOTIC RELEASE 30 MG PO TB24: 30.0000 mg | ORAL_TABLET | Freq: Every day | ORAL | 4 refills | Status: DC

## 2023-12-05 ENCOUNTER — Ambulatory Visit: Admitting: Women's Health

## 2023-12-12 ENCOUNTER — Ambulatory Visit: Admitting: Advanced Practice Midwife

## 2024-01-22 DIAGNOSIS — B3731 Acute candidiasis of vulva and vagina: Secondary | ICD-10-CM | POA: Diagnosis not present

## 2024-01-22 DIAGNOSIS — N3001 Acute cystitis with hematuria: Secondary | ICD-10-CM | POA: Diagnosis not present

## 2024-01-22 DIAGNOSIS — R350 Frequency of micturition: Secondary | ICD-10-CM | POA: Diagnosis not present

## 2024-06-30 DIAGNOSIS — A084 Viral intestinal infection, unspecified: Secondary | ICD-10-CM | POA: Diagnosis not present

## 2024-06-30 DIAGNOSIS — R509 Fever, unspecified: Secondary | ICD-10-CM | POA: Diagnosis not present

## 2024-07-22 ENCOUNTER — Ambulatory Visit: Admitting: Adult Health

## 2024-07-22 ENCOUNTER — Encounter: Payer: Self-pay | Admitting: Adult Health

## 2024-07-22 VITALS — BP 133/87 | HR 88 | Ht 60.0 in | Wt 194.5 lb

## 2024-07-22 DIAGNOSIS — Z113 Encounter for screening for infections with a predominantly sexual mode of transmission: Secondary | ICD-10-CM | POA: Insufficient documentation

## 2024-07-22 DIAGNOSIS — Z30011 Encounter for initial prescription of contraceptive pills: Secondary | ICD-10-CM | POA: Diagnosis not present

## 2024-07-22 DIAGNOSIS — Z3202 Encounter for pregnancy test, result negative: Secondary | ICD-10-CM | POA: Diagnosis not present

## 2024-07-22 LAB — POCT URINE PREGNANCY: Preg Test, Ur: NEGATIVE

## 2024-07-22 MED ORDER — LO LOESTRIN FE 1 MG-10 MCG / 10 MCG PO TABS: 1.0000 | ORAL_TABLET | Freq: Every day | ORAL | 3 refills | Status: AC

## 2024-07-22 NOTE — Progress Notes (Signed)
" °  Subjective:     Patient ID: Diana Schmitt, female   DOB: [birth year], 24 y.o.   MRN: [id number]  HPI Diana Schmitt is a 24 year old black female, single, G2P2 in to discuss birth control options. She has never used birth control.     Component Value Date/Time   DIAGPAP  01/07/2023 1111    - Negative for intraepithelial lesion or malignancy (NILM)   ADEQPAP  01/07/2023 1111    Satisfactory for evaluation; transformation zone component PRESENT.     Review of Systems Periods are regular Has hx gestational hypertension  Denies MI,stroke, DVT, breat cancer or migraine with aura Reviewed past medical,surgical, social and family history. Reviewed medications and allergies.     Objective:   Physical Exam BP 133/87 (BP Location: Right Arm, Patient Position: Sitting, Cuff Size: Normal)   Pulse 88   Ht 5' (1.524 m)   Wt 194 lb 8 oz (88.2 kg)   LMP 06/22/2024 (Exact Date)   Breastfeeding No   BMI 37.99 kg/m  UPT is negative    Skin warm and dry.  Lungs: clear to ausculation bilaterally. Cardiovascular: regular rate and rhythm.  Fall risk is low  Upstream - 07/22/24 0859       Pregnancy Intention Screening   Does the patient want to become pregnant in the next year? No    Does the patient's partner want to become pregnant in the next year? No    Would the patient like to discuss contraceptive options today? Yes      Contraception Wrap Up   Current Method Withdrawal or Other Method    End Method Oral Contraceptive   use condom for first pack   Contraception Counseling Provided Yes    How was the end contraceptive method provided? Prescription          Assessment:     1. Negative pregnancy test - POCT urine pregnancy  2. Encounter for initial prescription of contraceptive pills (Primary) Discussed options and she wants to try a pill, start with next period,take same time daily and use condoms for first pack. Will rx lo loestrin   Meds ordered this encounter  Medications    Norethindrone-Ethinyl Estradiol-Fe Biphas (LO LOESTRIN FE ) 1 MG-10 MCG / 10 MCG tablet    Sig: Take 1 tablet by mouth daily. Take 1 daily by mouth    Dispense:  84 tablet    Refill:  3    Supervising Provider:   JAYNE MINDER H [[id number]]   Follow up in 3 months for ROS and BP check   3. Screening examination for STD (sexually transmitted disease) Urine sent for GC/CHL - GC/Chlamydia Probe Amp     Plan:     Return in 3 months for ROS and BP check     "

## 2024-07-24 LAB — GC/CHLAMYDIA PROBE AMP
Chlamydia trachomatis, NAA: NEGATIVE
Neisseria Gonorrhoeae by PCR: NEGATIVE

## 2024-07-27 ENCOUNTER — Ambulatory Visit: Payer: Self-pay | Admitting: Adult Health

## 2024-10-21 ENCOUNTER — Ambulatory Visit: Admitting: Adult Health
